# Patient Record
Sex: Male | Born: 1950
Health system: Southern US, Community
[De-identification: ages and names within clinical notes are randomized; demographics above are authoritative.]

## PROBLEM LIST (undated history)

## (undated) DIAGNOSIS — E785 Hyperlipidemia, unspecified: Secondary | ICD-10-CM

## (undated) DIAGNOSIS — K219 Gastro-esophageal reflux disease without esophagitis: Secondary | ICD-10-CM

## (undated) DIAGNOSIS — I10 Essential (primary) hypertension: Secondary | ICD-10-CM

## (undated) DIAGNOSIS — M1990 Unspecified osteoarthritis, unspecified site: Secondary | ICD-10-CM

## (undated) HISTORY — PX: KNEE ARTHROSCOPY: SUR90

## (undated) HISTORY — PX: COLONOSCOPY: SHX174

## (undated) HISTORY — PX: VASECTOMY REVERSAL: SHX243

## (undated) HISTORY — PX: TOE SURGERY: SHX1073

## (undated) HISTORY — PX: TONSILLECTOMY: SUR1361

## (undated) HISTORY — PX: VASECTOMY: SHX75

---

## 2008-09-06 ENCOUNTER — Ambulatory Visit: Payer: Self-pay | Admitting: Internal Medicine

## 2008-09-12 ENCOUNTER — Ambulatory Visit: Payer: Self-pay | Admitting: Internal Medicine

## 2009-05-08 ENCOUNTER — Ambulatory Visit: Payer: Self-pay | Admitting: Gastroenterology

## 2014-04-14 ENCOUNTER — Other Ambulatory Visit (HOSPITAL_COMMUNITY): Payer: Self-pay | Admitting: Orthopedic Surgery

## 2014-04-27 ENCOUNTER — Encounter (HOSPITAL_COMMUNITY)
Admission: RE | Admit: 2014-04-27 | Discharge: 2014-04-27 | Disposition: A | Discharge status: Home / Self Care | Payer: Managed Care, Other (non HMO) | Source: Ambulatory Visit | Attending: Orthopedic Surgery | Admitting: Orthopedic Surgery

## 2014-04-27 ENCOUNTER — Encounter (HOSPITAL_COMMUNITY): Payer: Self-pay | Admitting: Emergency Medicine

## 2014-04-27 ENCOUNTER — Encounter (HOSPITAL_COMMUNITY): Payer: Self-pay | Admitting: Anesthesiology

## 2014-04-27 ENCOUNTER — Encounter (HOSPITAL_COMMUNITY): Payer: Self-pay

## 2014-04-27 DIAGNOSIS — Z01810 Encounter for preprocedural cardiovascular examination: Secondary | ICD-10-CM | POA: Diagnosis not present

## 2014-04-27 DIAGNOSIS — M179 Osteoarthritis of knee, unspecified: Secondary | ICD-10-CM | POA: Insufficient documentation

## 2014-04-27 DIAGNOSIS — Z01812 Encounter for preprocedural laboratory examination: Secondary | ICD-10-CM | POA: Diagnosis not present

## 2014-04-27 HISTORY — DX: Essential (primary) hypertension: I10

## 2014-04-27 HISTORY — DX: Gastro-esophageal reflux disease without esophagitis: K21.9

## 2014-04-27 HISTORY — DX: Unspecified osteoarthritis, unspecified site: M19.90

## 2014-04-27 LAB — SURGICAL PCR SCREEN
MRSA, PCR: NEGATIVE
Staphylococcus aureus: NEGATIVE

## 2014-04-27 LAB — BASIC METABOLIC PANEL
Anion gap: 14 (ref 5–15)
BUN: 21 mg/dL (ref 6–23)
CHLORIDE: 101 meq/L (ref 96–112)
CO2: 28 mEq/L (ref 19–32)
Calcium: 9.8 mg/dL (ref 8.4–10.5)
Creatinine, Ser: 1.3 mg/dL (ref 0.50–1.35)
GFR calc Af Amer: 65 mL/min — ABNORMAL LOW (ref >90)
GFR calc non Af Amer: 56 mL/min — ABNORMAL LOW (ref >90)
Glucose, Bld: 132 mg/dL — ABNORMAL HIGH (ref 70–99)
Potassium: 3.9 mEq/L (ref 3.7–5.3)
Sodium: 143 mEq/L (ref 137–147)

## 2014-04-27 LAB — CBC
HCT: 45.7 % (ref 39.0–52.0)
HEMOGLOBIN: 15.9 g/dL (ref 13.0–17.0)
MCH: 30.1 pg (ref 26.0–34.0)
MCHC: 34.8 g/dL (ref 30.0–36.0)
MCV: 86.4 fL (ref 78.0–100.0)
Platelets: 213 10*3/uL (ref 150–400)
RBC: 5.29 MIL/uL (ref 4.22–5.81)
RDW: 12.4 % (ref 11.5–15.5)
WBC: 7.2 10*3/uL (ref 4.0–10.5)

## 2014-04-27 LAB — TYPE AND SCREEN
ABO/RH(D): B POS
Antibody Screen: NEGATIVE

## 2014-04-27 LAB — ABO/RH: ABO/RH(D): B POS

## 2014-04-27 NOTE — Progress Notes (Addendum)
Called Dr. Randel Pigg office, left message with Maudie Mercury, scheduler, for him to sign orders.  Pt. Stayed to watch video.

## 2014-04-27 NOTE — Pre-Procedure Instructions (Signed)
[facility] [place]  04/27/2014   Your procedure is scheduled on:  Tuesday, Nov. 10  Report to [facility] Main Entrance "A" at 5:30  AM.  Call this number if you have problems the morning of surgery: [phone]                If any questions prior to surgery day call pre-admission [phone]   Remember:   Do not eat food or drink liquids after midnight.   Take these medicines the morning of surgery with A SIP OF WATER: none              Stop aspirin, aleeve 04/27/14   Do not wear jewelry, make-up or nail polish.  Do not wear lotions, powders, or perfumes. You may wear deodorant.  Do not shave 48 hours prior to surgery. Men may shave face and neck.  Do not bring valuables to the hospital.  [facility] is not responsible                  for any belongings or valuables.               Contacts, dentures or bridgework may not be worn into surgery.  Leave suitcase in the car. After surgery it may be brought to your room.  For patients admitted to the hospital, discharge time is determined by your                treatment team.               Patients discharged the day of surgery will not be allowed to drive  home.  Name and phone number of your driver:    Special Instructions: review  Preparing for surgery handout  Please read over the following fact sheets that you were given: Pain Booklet, Coughing and Deep Breathing, Blood Transfusion Information, MRSA Information and Surgical Site Infection Prevention

## 2014-04-27 NOTE — Progress Notes (Addendum)
Anesthesia Chart Review:  Pt is 63 year old male scheduled for L total knee arthroplasty on 05/03/2014 with Dr. Marlou Sa. Former smoker. BMI 30  PMH: HTN, GERD  Medications include: ASA, valsartan/hctz, lipitor, naproxen  Preoperative labs reviewed.    EKG: NSR, incomplete RBBB, L posterior fasicular block  No prior EKG in Muse or Epic. Pt's PCP, Dr. Baldemar Lenis at [facility] in [place], does not have an EKG either.   Reviewed with Dr. Al Corpus. Pt needs cardiac clearance prior to surgery. Cheryl in Dr. Randel Pigg office notified.   Willeen Cass, FNP-BC [facility] Short Stay Surgical Center/Anesthesiology Phone: [phone] 04/27/2014 5:00 PM

## 2014-05-02 MED ORDER — CEFAZOLIN SODIUM-DEXTROSE 2-3 GM-% IV SOLR: 2.0000 g | INTRAVENOUS | Status: DC

## 2014-05-03 ENCOUNTER — Inpatient Hospital Stay (HOSPITAL_COMMUNITY)
Admission: RE | Admit: 2014-05-03 | Payer: Managed Care, Other (non HMO) | Source: Ambulatory Visit | Admitting: Orthopedic Surgery

## 2014-05-03 ENCOUNTER — Encounter (HOSPITAL_COMMUNITY): Admission: RE | Payer: Self-pay | Source: Ambulatory Visit

## 2014-05-03 SURGERY — ARTHROPLASTY, KNEE, TOTAL
Anesthesia: General | Site: Knee | Laterality: Left

## 2014-05-13 ENCOUNTER — Other Ambulatory Visit (HOSPITAL_COMMUNITY): Payer: Self-pay | Admitting: Orthopedic Surgery

## 2014-06-21 ENCOUNTER — Other Ambulatory Visit (HOSPITAL_COMMUNITY): Payer: Self-pay | Admitting: *Deleted

## 2014-06-21 NOTE — Pre-Procedure Instructions (Signed)
Billy Fields  06/21/2014   Your procedure is scheduled on:  Tuesday, July 05, 2014 at 7:30 AM.   Report to [facility] Entrance "A" Admitting Office at 5:30 AM.   Call this number if you have problems the morning of surgery: [phone]               Any questions prior to day of surgery, please call [phone] between 8 & 4 PM.   Remember:   Do not eat food or drink liquids after midnight Monday, 07/04/14.   Take these medicines the morning of surgery with A SIP OF WATER:  None  Stop Aspirin, Vitamins and NSAIDS (Naprosyn, Aleve, Ibuprofen, etc)  as of 06/28/14.   Do not wear jewelry.  Do not wear lotions, powders, or colgone. You may wear deodorant.  Men may shave face and neck.  Do not bring valuables to the hospital.  [facility] is not responsible                  for any belongings or valuables.               Contacts, dentures or bridgework may not be worn into surgery.  Leave suitcase in the car. After surgery it may be brought to your room.  For patients admitted to the hospital, discharge time is determined by your                treatment team.    Special Instructions: [place] - Preparing for Surgery  Before surgery, you can play an important role.  Because skin is not sterile, your skin needs to be as free of germs as possible.  You can reduce the number of germs on you skin by washing with CHG (chlorahexidine gluconate) soap before surgery.  CHG is an antiseptic cleaner which kills germs and bonds with the skin to continue killing germs even after washing.  Please DO NOT use if you have an allergy to CHG or antibacterial soaps.  If your skin becomes reddened/irritated stop using the CHG and inform your nurse when you arrive at Short Stay.  Do not shave (including legs and underarms) for at least 48 hours prior to the first CHG shower.  You may shave your face.  Please follow these instructions carefully:   1.  Shower with CHG Soap the night before  surgery and the                                morning of Surgery.  2.  If you choose to wash your hair, wash your hair first as usual with your       normal shampoo.  3.  After you shampoo, rinse your hair and body thoroughly to remove the                      Shampoo.  4.  Use CHG as you would any other liquid soap.  You can apply chg directly       to the skin and wash gently with scrungie or a clean washcloth.  5.  Apply the CHG Soap to your body ONLY FROM THE NECK DOWN.        Do not use on open wounds or open sores.  Avoid contact with your eyes, ears, mouth and genitals (private parts).  Wash genitals (private parts) with your normal soap.  6.  Wash thoroughly, paying special attention to the area where your surgery        will be performed.  7.  Thoroughly rinse your body with warm water from the neck down.  8.  DO NOT shower/wash with your normal soap after using and rinsing off       the CHG Soap.  9.  Pat yourself dry with a clean towel.            10.  Wear clean pajamas.            11.  Place clean sheets on your bed the night of your first shower and do not        sleep with pets.  Day of Surgery  Do not apply any lotions the morning of surgery.  Please wear clean clothes to the hospital.     Please read over the following fact sheets that you were given: Pain Booklet, Coughing and Deep Breathing, Blood Transfusion Information, MRSA Information and Surgical Site Infection Prevention

## 2014-06-22 ENCOUNTER — Encounter (HOSPITAL_COMMUNITY)
Admission: RE | Admit: 2014-06-22 | Discharge: 2014-06-22 | Disposition: A | Discharge status: Home / Self Care | Payer: Managed Care, Other (non HMO) | Source: Ambulatory Visit | Attending: Orthopedic Surgery | Admitting: Orthopedic Surgery

## 2014-06-22 ENCOUNTER — Encounter (HOSPITAL_COMMUNITY): Payer: Self-pay

## 2014-06-22 DIAGNOSIS — R9431 Abnormal electrocardiogram [ECG] [EKG]: Secondary | ICD-10-CM | POA: Diagnosis not present

## 2014-06-22 DIAGNOSIS — I4519 Other right bundle-branch block: Secondary | ICD-10-CM | POA: Diagnosis not present

## 2014-06-22 DIAGNOSIS — I10 Essential (primary) hypertension: Secondary | ICD-10-CM | POA: Diagnosis not present

## 2014-06-22 DIAGNOSIS — Z01818 Encounter for other preprocedural examination: Secondary | ICD-10-CM

## 2014-06-22 DIAGNOSIS — J439 Emphysema, unspecified: Secondary | ICD-10-CM | POA: Insufficient documentation

## 2014-06-22 LAB — SURGICAL PCR SCREEN
MRSA, PCR: NEGATIVE
Staphylococcus aureus: NEGATIVE

## 2014-06-22 LAB — URINALYSIS, ROUTINE W REFLEX MICROSCOPIC
BILIRUBIN URINE: NEGATIVE
GLUCOSE, UA: NEGATIVE mg/dL
HGB URINE DIPSTICK: NEGATIVE
KETONES UR: NEGATIVE mg/dL
LEUKOCYTES UA: NEGATIVE
Nitrite: NEGATIVE
Protein, ur: NEGATIVE mg/dL
Specific Gravity, Urine: 1.017 (ref 1.005–1.030)
Urobilinogen, UA: 0.2 mg/dL (ref 0.0–1.0)
pH: 7.5 (ref 5.0–8.0)

## 2014-06-22 LAB — CBC
HEMATOCRIT: 48.8 % (ref 39.0–52.0)
Hemoglobin: 16.5 g/dL (ref 13.0–17.0)
MCH: 29.3 pg (ref 26.0–34.0)
MCHC: 33.8 g/dL (ref 30.0–36.0)
MCV: 86.5 fL (ref 78.0–100.0)
Platelets: 198 10*3/uL (ref 150–400)
RBC: 5.64 MIL/uL (ref 4.22–5.81)
RDW: 12.5 % (ref 11.5–15.5)
WBC: 6 10*3/uL (ref 4.0–10.5)

## 2014-06-22 LAB — BASIC METABOLIC PANEL
ANION GAP: 7 (ref 5–15)
BUN: 15 mg/dL (ref 6–23)
CHLORIDE: 105 meq/L (ref 96–112)
CO2: 27 mmol/L (ref 19–32)
Calcium: 9.6 mg/dL (ref 8.4–10.5)
Creatinine, Ser: 1.26 mg/dL (ref 0.50–1.35)
GFR calc Af Amer: 68 mL/min — ABNORMAL LOW (ref >90)
GFR, EST NON AFRICAN AMERICAN: 59 mL/min — AB (ref >90)
Glucose, Bld: 100 mg/dL — ABNORMAL HIGH (ref 70–99)
Potassium: 3.7 mmol/L (ref 3.5–5.1)
Sodium: 139 mmol/L (ref 135–145)

## 2014-06-22 LAB — TYPE AND SCREEN
ABO/RH(D): B POS
ANTIBODY SCREEN: NEGATIVE

## 2014-06-22 LAB — APTT: APTT: 31 s (ref 24–37)

## 2014-06-22 LAB — PROTIME-INR
INR: 1.05 (ref 0.00–1.49)
Prothrombin Time: 13.9 seconds (ref 11.6–15.2)

## 2014-06-22 NOTE — Progress Notes (Signed)
Anesthesia follow-up:  See note from Willeen Cass, FNP-BC from 04/27/14.  Patient has seen been evaluated by cardiologists Dr. Vear Clock and Dr. Adrian Prows [facility] CV) and felt acceptable risk for surgery following a low risk stress test. He is now scheduled to undergo left TKA on 07/05/14.  Nuclear stress test 05/06/14: Resting ECG: NSR, incomplete right BBB, poor r wave progression and no resting arrhythmias. Stress ECG was non-diagnostic due to low pharmacological stress. Myocardial perfusion imaging is normal. Overall LV systolic functinon was normal without regional wall motion abormalities. The LVEF was 64%. Based on the study, patient can be taken up for upcoming surgery with acceptable CV risk.   Echo 05/03/14: LV cavity is normal is size. Normal global wall motion. Grade I (impaired) diastolic dysfunction. Calculated EF 55%. Trileaflet AV with mild AR. Trace MR/TR.  06/22/14 CXR: There is a degree of underlying emphysematous change. No edema or consolidation.  Preoperative labs noted. Urine culture is still pending.   If no acute changes then I anticipate that he can proceed as planned.  George Hugh [facility] Short Stay Center/Anesthesiology Phone [phone] 06/22/2014 11:43 AM

## 2014-06-22 NOTE — Progress Notes (Signed)
Pt states he had a cardiac clearance done in November but he can't remember the name of the cardiologist. After some searching found that it was Dr. Einar Gip. Called Dr. Irven Shelling office and requested copies of OV notes, Echo and Stress test.to be faxed to [place]. Pt states he was told he was cleared.

## 2014-06-23 LAB — URINE CULTURE: Colony Count: 5000

## 2014-07-04 MED ORDER — CEFAZOLIN SODIUM-DEXTROSE 2-3 GM-% IV SOLR
2.0000 g | INTRAVENOUS | Status: AC
  Administered 2014-07-05: 2 g via INTRAVENOUS
  Filled 2014-07-04: qty 50

## 2014-07-05 ENCOUNTER — Encounter (HOSPITAL_COMMUNITY): Payer: Self-pay | Admitting: *Deleted

## 2014-07-05 ENCOUNTER — Inpatient Hospital Stay (HOSPITAL_COMMUNITY)
Admission: RE | Admit: 2014-07-05 | Discharge: 2014-07-08 | DRG: 470 | Disposition: A | Discharge status: Home Health | Payer: Managed Care, Other (non HMO) | Source: Ambulatory Visit | Attending: Orthopedic Surgery | Admitting: Orthopedic Surgery

## 2014-07-05 ENCOUNTER — Encounter (HOSPITAL_COMMUNITY)
Admission: RE | Disposition: A | Discharge status: Home Health | Payer: Self-pay | Source: Ambulatory Visit | Attending: Orthopedic Surgery

## 2014-07-05 ENCOUNTER — Inpatient Hospital Stay (HOSPITAL_COMMUNITY): Payer: Managed Care, Other (non HMO) | Admitting: Anesthesiology

## 2014-07-05 ENCOUNTER — Inpatient Hospital Stay (HOSPITAL_COMMUNITY): Payer: Managed Care, Other (non HMO) | Admitting: Vascular Surgery

## 2014-07-05 DIAGNOSIS — K219 Gastro-esophageal reflux disease without esophagitis: Secondary | ICD-10-CM | POA: Diagnosis present

## 2014-07-05 DIAGNOSIS — Z8249 Family history of ischemic heart disease and other diseases of the circulatory system: Secondary | ICD-10-CM | POA: Diagnosis not present

## 2014-07-05 DIAGNOSIS — Z87891 Personal history of nicotine dependence: Secondary | ICD-10-CM

## 2014-07-05 DIAGNOSIS — M179 Osteoarthritis of knee, unspecified: Principal | ICD-10-CM | POA: Diagnosis present

## 2014-07-05 DIAGNOSIS — I10 Essential (primary) hypertension: Secondary | ICD-10-CM | POA: Diagnosis present

## 2014-07-05 DIAGNOSIS — Z7901 Long term (current) use of anticoagulants: Secondary | ICD-10-CM

## 2014-07-05 DIAGNOSIS — Z7982 Long term (current) use of aspirin: Secondary | ICD-10-CM | POA: Diagnosis not present

## 2014-07-05 DIAGNOSIS — M1710 Unilateral primary osteoarthritis, unspecified knee: Secondary | ICD-10-CM | POA: Diagnosis present

## 2014-07-05 DIAGNOSIS — M25562 Pain in left knee: Secondary | ICD-10-CM | POA: Diagnosis present

## 2014-07-05 DIAGNOSIS — Z79899 Other long term (current) drug therapy: Secondary | ICD-10-CM | POA: Diagnosis not present

## 2014-07-05 HISTORY — PX: TOTAL KNEE ARTHROPLASTY: SHX125

## 2014-07-05 SURGERY — ARTHROPLASTY, KNEE, TOTAL
Anesthesia: Monitor Anesthesia Care | Site: Knee | Laterality: Left

## 2014-07-05 MED ORDER — FENTANYL CITRATE 0.05 MG/ML IJ SOLN
INTRAMUSCULAR | Status: DC | PRN
  Administered 2014-07-05 (×2): 50 ug via INTRAVENOUS

## 2014-07-05 MED ORDER — ADULT MULTIVITAMIN W/MINERALS CH
1.0000 | ORAL_TABLET | Freq: Every day | ORAL | Status: DC
  Administered 2014-07-06 – 2014-07-08 (×3): 1 via ORAL
  Filled 2014-07-05 (×3): qty 1

## 2014-07-05 MED ORDER — SODIUM CHLORIDE 0.9 % IV SOLN
1000.0000 mg | INTRAVENOUS | Status: AC
  Administered 2014-07-05: 1000 mg via INTRAVENOUS
  Filled 2014-07-05: qty 10

## 2014-07-05 MED ORDER — LIDOCAINE HCL (CARDIAC) 20 MG/ML IV SOLN
INTRAVENOUS | Status: DC | PRN
  Administered 2014-07-05: 50 mg via INTRAVENOUS

## 2014-07-05 MED ORDER — BUPIVACAINE IN DEXTROSE 0.75-8.25 % IT SOLN
INTRATHECAL | Status: DC | PRN
  Administered 2014-07-05: 15 mg via INTRATHECAL

## 2014-07-05 MED ORDER — OXYCODONE HCL 5 MG PO TABS
ORAL_TABLET | ORAL | Status: AC
  Administered 2014-07-05: 5 mg
  Filled 2014-07-05: qty 1

## 2014-07-05 MED ORDER — DEXAMETHASONE SODIUM PHOSPHATE 4 MG/ML IJ SOLN
INTRAMUSCULAR | Status: DC | PRN
  Administered 2014-07-05: 4 mg via INTRAVENOUS

## 2014-07-05 MED ORDER — PHENOL 1.4 % MT LIQD: 1.0000 | OROMUCOSAL | Status: DC | PRN

## 2014-07-05 MED ORDER — CLONIDINE HCL (ANALGESIA) 100 MCG/ML EP SOLN
150.0000 ug | Freq: Once | EPIDURAL | Status: AC
  Administered 2014-07-05: 100 ug via INTRA_ARTICULAR
  Filled 2014-07-05: qty 1.5

## 2014-07-05 MED ORDER — METOCLOPRAMIDE HCL 10 MG PO TABS: 5.0000 mg | ORAL_TABLET | Freq: Three times a day (TID) | ORAL | Status: DC | PRN

## 2014-07-05 MED ORDER — MIDAZOLAM HCL 2 MG/2ML IJ SOLN
INTRAMUSCULAR | Status: AC
  Filled 2014-07-05: qty 2

## 2014-07-05 MED ORDER — MENTHOL 3 MG MT LOZG: 1.0000 | LOZENGE | OROMUCOSAL | Status: DC | PRN

## 2014-07-05 MED ORDER — WARFARIN - PHARMACIST DOSING INPATIENT: Freq: Every day | Status: DC

## 2014-07-05 MED ORDER — WARFARIN SODIUM 6 MG PO TABS
6.0000 mg | ORAL_TABLET | Freq: Once | ORAL | Status: AC
  Administered 2014-07-05: 6 mg via ORAL
  Filled 2014-07-05: qty 1

## 2014-07-05 MED ORDER — WARFARIN VIDEO: Freq: Once | Status: DC

## 2014-07-05 MED ORDER — PROPOFOL 10 MG/ML IV BOLUS
INTRAVENOUS | Status: AC
  Filled 2014-07-05: qty 20

## 2014-07-05 MED ORDER — BUPIVACAINE HCL (PF) 0.25 % IJ SOLN
INTRAMUSCULAR | Status: AC
  Filled 2014-07-05: qty 30

## 2014-07-05 MED ORDER — DEXAMETHASONE SODIUM PHOSPHATE 4 MG/ML IJ SOLN
INTRAMUSCULAR | Status: AC
  Filled 2014-07-05: qty 1

## 2014-07-05 MED ORDER — METOCLOPRAMIDE HCL 5 MG/ML IJ SOLN: 5.0000 mg | Freq: Three times a day (TID) | INTRAMUSCULAR | Status: DC | PRN

## 2014-07-05 MED ORDER — MORPHINE SULFATE 4 MG/ML IJ SOLN
INTRAMUSCULAR | Status: DC | PRN
  Administered 2014-07-05: 8 mg

## 2014-07-05 MED ORDER — SODIUM CHLORIDE 0.9 % IJ SOLN
INTRAMUSCULAR | Status: AC
  Filled 2014-07-05: qty 10

## 2014-07-05 MED ORDER — PROPOFOL INFUSION 10 MG/ML OPTIME
INTRAVENOUS | Status: DC | PRN
  Administered 2014-07-05: 10:00:00 via INTRAVENOUS
  Administered 2014-07-05: 75 ug/kg/min via INTRAVENOUS

## 2014-07-05 MED ORDER — FENTANYL CITRATE 0.05 MG/ML IJ SOLN
INTRAMUSCULAR | Status: AC
  Filled 2014-07-05: qty 5

## 2014-07-05 MED ORDER — HYDROMORPHONE HCL 1 MG/ML IJ SOLN
INTRAMUSCULAR | Status: AC
  Filled 2014-07-05: qty 1

## 2014-07-05 MED ORDER — SUCCINYLCHOLINE CHLORIDE 20 MG/ML IJ SOLN
INTRAMUSCULAR | Status: AC
  Filled 2014-07-05: qty 1

## 2014-07-05 MED ORDER — BUPIVACAINE-EPINEPHRINE (PF) 0.5% -1:200000 IJ SOLN
INTRAMUSCULAR | Status: AC
  Filled 2014-07-05: qty 30

## 2014-07-05 MED ORDER — BUPIVACAINE-EPINEPHRINE 0.5% -1:200000 IJ SOLN
INTRAMUSCULAR | Status: DC | PRN
  Administered 2014-07-05: 20 mL

## 2014-07-05 MED ORDER — 0.9 % SODIUM CHLORIDE (POUR BTL) OPTIME
TOPICAL | Status: DC | PRN
  Administered 2014-07-05 (×2): 1000 mL

## 2014-07-05 MED ORDER — HYDROCHLOROTHIAZIDE 25 MG PO TABS
25.0000 mg | ORAL_TABLET | Freq: Every day | ORAL | Status: DC
  Administered 2014-07-05 – 2014-07-08 (×4): 25 mg via ORAL
  Filled 2014-07-05 (×4): qty 1

## 2014-07-05 MED ORDER — LACTATED RINGERS IV SOLN
INTRAVENOUS | Status: DC | PRN
  Administered 2014-07-05 (×2): via INTRAVENOUS

## 2014-07-05 MED ORDER — ACETAMINOPHEN 325 MG PO TABS
650.0000 mg | ORAL_TABLET | Freq: Four times a day (QID) | ORAL | Status: DC | PRN
Start: 2014-07-05 — End: 2014-07-08

## 2014-07-05 MED ORDER — CLONIDINE HCL (ANALGESIA) 100 MCG/ML EP SOLN: EPIDURAL | Status: DC | PRN

## 2014-07-05 MED ORDER — ONDANSETRON HCL 4 MG/2ML IJ SOLN
INTRAMUSCULAR | Status: AC
  Filled 2014-07-05: qty 2

## 2014-07-05 MED ORDER — HYDROMORPHONE HCL 1 MG/ML IJ SOLN
0.2500 mg | INTRAMUSCULAR | Status: DC | PRN
  Administered 2014-07-05 (×2): 0.5 mg via INTRAVENOUS

## 2014-07-05 MED ORDER — VALSARTAN-HYDROCHLOROTHIAZIDE 320-25 MG PO TABS: 1.0000 | ORAL_TABLET | Freq: Every day | ORAL | Status: DC

## 2014-07-05 MED ORDER — PHENYLEPHRINE 40 MCG/ML (10ML) SYRINGE FOR IV PUSH (FOR BLOOD PRESSURE SUPPORT)
PREFILLED_SYRINGE | INTRAVENOUS | Status: AC
  Filled 2014-07-05: qty 10

## 2014-07-05 MED ORDER — SODIUM CHLORIDE 0.9 % IJ SOLN
INTRAMUSCULAR | Status: DC | PRN
  Administered 2014-07-05: 60 mL

## 2014-07-05 MED ORDER — IRBESARTAN 300 MG PO TABS
300.0000 mg | ORAL_TABLET | Freq: Every day | ORAL | Status: DC
  Administered 2014-07-05 – 2014-07-08 (×4): 300 mg via ORAL
  Filled 2014-07-05 (×4): qty 1

## 2014-07-05 MED ORDER — OXYCODONE HCL 5 MG PO TABS
5.0000 mg | ORAL_TABLET | ORAL | Status: DC | PRN
  Administered 2014-07-05: 5 mg via ORAL
  Administered 2014-07-05 – 2014-07-08 (×12): 10 mg via ORAL
  Filled 2014-07-05 (×14): qty 2

## 2014-07-05 MED ORDER — ROCURONIUM BROMIDE 50 MG/5ML IV SOLN
INTRAVENOUS | Status: AC
  Filled 2014-07-05: qty 1

## 2014-07-05 MED ORDER — OXYCODONE HCL 5 MG PO TABS
ORAL_TABLET | ORAL | Status: AC
  Filled 2014-07-05: qty 1

## 2014-07-05 MED ORDER — METHOCARBAMOL 1000 MG/10ML IJ SOLN
500.0000 mg | INTRAVENOUS | Status: AC
  Administered 2014-07-05: 500 mg via INTRAVENOUS
  Filled 2014-07-05: qty 5

## 2014-07-05 MED ORDER — EPHEDRINE SULFATE 50 MG/ML IJ SOLN
INTRAMUSCULAR | Status: AC
  Filled 2014-07-05: qty 1

## 2014-07-05 MED ORDER — CHLORHEXIDINE GLUCONATE 4 % EX LIQD
60.0000 mL | Freq: Once | CUTANEOUS | Status: DC
  Filled 2014-07-05: qty 60

## 2014-07-05 MED ORDER — LIDOCAINE HCL (CARDIAC) 20 MG/ML IV SOLN
INTRAVENOUS | Status: AC
  Filled 2014-07-05: qty 5

## 2014-07-05 MED ORDER — ONDANSETRON HCL 4 MG/2ML IJ SOLN: 4.0000 mg | Freq: Four times a day (QID) | INTRAMUSCULAR | Status: DC | PRN

## 2014-07-05 MED ORDER — CEFAZOLIN SODIUM-DEXTROSE 2-3 GM-% IV SOLR
2.0000 g | Freq: Three times a day (TID) | INTRAVENOUS | Status: AC
  Administered 2014-07-05 – 2014-07-06 (×2): 2 g via INTRAVENOUS
  Filled 2014-07-05 (×2): qty 50

## 2014-07-05 MED ORDER — COUMADIN BOOK
Freq: Once | Status: DC
  Filled 2014-07-05: qty 1

## 2014-07-05 MED ORDER — ATORVASTATIN CALCIUM 10 MG PO TABS
10.0000 mg | ORAL_TABLET | Freq: Every day | ORAL | Status: DC
  Administered 2014-07-05 – 2014-07-07 (×3): 10 mg via ORAL
  Filled 2014-07-05 (×4): qty 1

## 2014-07-05 MED ORDER — MIDAZOLAM HCL 5 MG/5ML IJ SOLN
INTRAMUSCULAR | Status: DC | PRN
  Administered 2014-07-05: 2 mg via INTRAVENOUS

## 2014-07-05 MED ORDER — ONDANSETRON HCL 4 MG PO TABS: 4.0000 mg | ORAL_TABLET | Freq: Four times a day (QID) | ORAL | Status: DC | PRN

## 2014-07-05 MED ORDER — POTASSIUM CHLORIDE IN NACL 20-0.9 MEQ/L-% IV SOLN
INTRAVENOUS | Status: AC
  Administered 2014-07-05 – 2014-07-06 (×2): via INTRAVENOUS
  Filled 2014-07-05 (×2): qty 1000

## 2014-07-05 MED ORDER — MORPHINE SULFATE 4 MG/ML IJ SOLN
INTRAMUSCULAR | Status: AC
  Filled 2014-07-05: qty 2

## 2014-07-05 MED ORDER — METHOCARBAMOL 1000 MG/10ML IJ SOLN
500.0000 mg | Freq: Four times a day (QID) | INTRAVENOUS | Status: DC | PRN
  Filled 2014-07-05: qty 5

## 2014-07-05 MED ORDER — GLYCOPYRROLATE 0.2 MG/ML IJ SOLN
INTRAMUSCULAR | Status: AC
  Filled 2014-07-05: qty 1

## 2014-07-05 MED ORDER — METHOCARBAMOL 500 MG PO TABS
500.0000 mg | ORAL_TABLET | Freq: Four times a day (QID) | ORAL | Status: DC | PRN
  Administered 2014-07-05 – 2014-07-08 (×6): 500 mg via ORAL
  Filled 2014-07-05 (×6): qty 1

## 2014-07-05 MED ORDER — SODIUM CHLORIDE 0.9 % IR SOLN
Status: DC | PRN
  Administered 2014-07-05: 3000 mL
  Administered 2014-07-05 (×2): 1000 mL

## 2014-07-05 MED ORDER — ONDANSETRON HCL 4 MG/2ML IJ SOLN
INTRAMUSCULAR | Status: DC | PRN
  Administered 2014-07-05: 4 mg via INTRAVENOUS

## 2014-07-05 MED ORDER — MORPHINE SULFATE 4 MG/ML IJ SOLN
4.0000 mg | INTRAMUSCULAR | Status: DC | PRN
  Administered 2014-07-05 – 2014-07-06 (×2): 4 mg via INTRAVENOUS
  Filled 2014-07-05 (×2): qty 1

## 2014-07-05 MED ORDER — ACETAMINOPHEN 650 MG RE SUPP: 650.0000 mg | Freq: Four times a day (QID) | RECTAL | Status: DC | PRN

## 2014-07-05 MED ORDER — BUPIVACAINE LIPOSOME 1.3 % IJ SUSP
20.0000 mL | INTRAMUSCULAR | Status: AC
  Administered 2014-07-05: 20 mL
  Filled 2014-07-05: qty 20

## 2014-07-05 SURGICAL SUPPLY — 78 items
BANDAGE ELASTIC 4 VELCRO ST LF (GAUZE/BANDAGES/DRESSINGS) IMPLANT
BANDAGE ESMARK 6X9 LF (GAUZE/BANDAGES/DRESSINGS) ×1 IMPLANT
BLADE SAG 18X100X1.27 (BLADE) ×3 IMPLANT
BLADE SAW SGTL 13.0X1.19X90.0M (BLADE) ×3 IMPLANT
BNDG COHESIVE 6X5 TAN STRL LF (GAUZE/BANDAGES/DRESSINGS) ×3 IMPLANT
BNDG ELASTIC 6X10 VLCR STRL LF (GAUZE/BANDAGES/DRESSINGS) ×3 IMPLANT
BNDG ESMARK 6X9 LF (GAUZE/BANDAGES/DRESSINGS) ×3
BOWL SMART MIX CTS (DISPOSABLE) ×3 IMPLANT
CAPT KNEE TOTAL 3 ×3 IMPLANT
CEMENT BONE SIMPLEX SPEEDSET (Cement) ×6 IMPLANT
COVER SURGICAL LIGHT HANDLE (MISCELLANEOUS) ×3 IMPLANT
CUFF TOURNIQUET SINGLE 34IN LL (TOURNIQUET CUFF) ×3 IMPLANT
CUFF TOURNIQUET SINGLE 44IN (TOURNIQUET CUFF) IMPLANT
DRAPE IMP U-DRAPE 54X76 (DRAPES) IMPLANT
DRAPE INCISE IOBAN 66X45 STRL (DRAPES) ×3 IMPLANT
DRAPE ORTHO SPLIT 77X108 STRL (DRAPES) ×6
DRAPE SURG ORHT 6 SPLT 77X108 (DRAPES) ×3 IMPLANT
DRAPE U-SHAPE 47X51 STRL (DRAPES) ×3 IMPLANT
DRSG AQUACEL AG ADV 3.5X14 (GAUZE/BANDAGES/DRESSINGS) ×3 IMPLANT
DRSG PAD ABDOMINAL 8X10 ST (GAUZE/BANDAGES/DRESSINGS) ×3 IMPLANT
DURAPREP 26ML APPLICATOR (WOUND CARE) ×3 IMPLANT
ELECT REM PT RETURN 9FT ADLT (ELECTROSURGICAL) ×3
ELECTRODE REM PT RTRN 9FT ADLT (ELECTROSURGICAL) ×1 IMPLANT
FACESHIELD WRAPAROUND (MASK) IMPLANT
GAUZE SPONGE 4X4 12PLY STRL (GAUZE/BANDAGES/DRESSINGS) IMPLANT
GAUZE XEROFORM 5X9 LF (GAUZE/BANDAGES/DRESSINGS) IMPLANT
GLOVE BIOGEL PI IND STRL 6.5 (GLOVE) ×1 IMPLANT
GLOVE BIOGEL PI IND STRL 7.5 (GLOVE) ×1 IMPLANT
GLOVE BIOGEL PI IND STRL 8 (GLOVE) ×1 IMPLANT
GLOVE BIOGEL PI INDICATOR 6.5 (GLOVE) ×2
GLOVE BIOGEL PI INDICATOR 7.5 (GLOVE) ×2
GLOVE BIOGEL PI INDICATOR 8 (GLOVE) ×2
GLOVE ECLIPSE 6.5 STRL STRAW (GLOVE) ×3 IMPLANT
GLOVE ECLIPSE 7.0 STRL STRAW (GLOVE) ×6 IMPLANT
GLOVE SURG ORTHO 8.0 STRL STRW (GLOVE) ×3 IMPLANT
GOWN STRL REUS W/ TWL LRG LVL3 (GOWN DISPOSABLE) ×2 IMPLANT
GOWN STRL REUS W/ TWL XL LVL3 (GOWN DISPOSABLE) ×1 IMPLANT
GOWN STRL REUS W/TWL LRG LVL3 (GOWN DISPOSABLE) ×4
GOWN STRL REUS W/TWL XL LVL3 (GOWN DISPOSABLE) ×2
HANDPIECE INTERPULSE COAX TIP (DISPOSABLE) ×2
HOOD PEEL AWAY FACE SHEILD DIS (HOOD) ×9 IMPLANT
IMMOBILIZER KNEE 20 (SOFTGOODS) IMPLANT
IMMOBILIZER KNEE 22 (SOFTGOODS) ×3 IMPLANT
IMMOBILIZER KNEE 22 UNIV (SOFTGOODS) ×3 IMPLANT
IMMOBILIZER KNEE 24 THIGH 36 (MISCELLANEOUS) IMPLANT
IMMOBILIZER KNEE 24 UNIV (MISCELLANEOUS)
KIT BASIN OR (CUSTOM PROCEDURE TRAY) ×3 IMPLANT
KIT ROOM TURNOVER OR (KITS) ×3 IMPLANT
MANIFOLD NEPTUNE II (INSTRUMENTS) ×3 IMPLANT
NEEDLE 18GX1X1/2 (RX/OR ONLY) (NEEDLE) IMPLANT
NEEDLE 21X1 OR PACK (NEEDLE) IMPLANT
NEEDLE HYPO 22GX1.5 SAFETY (NEEDLE) ×6 IMPLANT
NEEDLE SPNL 18GX3.5 QUINCKE PK (NEEDLE) ×3 IMPLANT
NS IRRIG 1000ML POUR BTL (IV SOLUTION) ×6 IMPLANT
PACK TOTAL JOINT (CUSTOM PROCEDURE TRAY) ×3 IMPLANT
PACK UNIVERSAL I (CUSTOM PROCEDURE TRAY) ×3 IMPLANT
PAD ARMBOARD 7.5X6 YLW CONV (MISCELLANEOUS) ×6 IMPLANT
PAD CAST 4YDX4 CTTN HI CHSV (CAST SUPPLIES) IMPLANT
PADDING CAST COTTON 4X4 STRL (CAST SUPPLIES)
PADDING CAST COTTON 6X4 STRL (CAST SUPPLIES) ×3 IMPLANT
RUBBERBAND STERILE (MISCELLANEOUS) IMPLANT
SET HNDPC FAN SPRY TIP SCT (DISPOSABLE) ×1 IMPLANT
SPONGE LAP 18X18 X RAY DECT (DISPOSABLE) ×3 IMPLANT
STAPLER VISISTAT 35W (STAPLE) ×3 IMPLANT
SUCTION FRAZIER TIP 10 FR DISP (SUCTIONS) ×3 IMPLANT
SUT VIC AB 0 CTB1 27 (SUTURE) ×9 IMPLANT
SUT VIC AB 1 CT1 27 (SUTURE) ×12
SUT VIC AB 1 CT1 27XBRD ANBCTR (SUTURE) ×6 IMPLANT
SUT VIC AB 2-0 CT1 27 (SUTURE) ×4
SUT VIC AB 2-0 CT1 TAPERPNT 27 (SUTURE) ×2 IMPLANT
SYR 30ML LL (SYRINGE) IMPLANT
SYR 30ML SLIP (SYRINGE) IMPLANT
SYR 50ML LL SCALE MARK (SYRINGE) IMPLANT
SYR TB 1ML LUER SLIP (SYRINGE) ×3 IMPLANT
TOWEL OR 17X24 6PK STRL BLUE (TOWEL DISPOSABLE) ×6 IMPLANT
TOWEL OR 17X26 10 PK STRL BLUE (TOWEL DISPOSABLE) ×6 IMPLANT
TRAY FOLEY CATH 16FRSI W/METER (SET/KITS/TRAYS/PACK) ×3 IMPLANT
WATER STERILE IRR 1000ML POUR (IV SOLUTION) IMPLANT

## 2014-07-05 NOTE — Anesthesia Preprocedure Evaluation (Addendum)
Anesthesia Evaluation  Patient identified by MRN, date of birth, ID band Patient awake    Reviewed: Allergy & Precautions, H&P , NPO status , Patient's Chart, lab work & pertinent test results  Airway Mallampati: II  TM Distance: >3 FB Neck ROM: Full    Dental no notable dental hx. (+) Teeth Intact, Dental Advisory Given   Pulmonary neg pulmonary ROS, former smoker,  breath sounds clear to auscultation  Pulmonary exam normal       Cardiovascular hypertension, Pt. on medications Rhythm:Regular Rate:Normal     Neuro/Psych negative neurological ROS  negative psych ROS   GI/Hepatic Neg liver ROS, GERD-  Controlled,  Endo/Other  negative endocrine ROS  Renal/GU negative Renal ROS  negative genitourinary   Musculoskeletal  (+) Arthritis -, Osteoarthritis,    Abdominal   Peds  Hematology negative hematology ROS (+)   Anesthesia Other Findings   Reproductive/Obstetrics negative OB ROS                            Anesthesia Physical Anesthesia Plan  ASA: II  Anesthesia Plan: MAC and Spinal   Post-op Pain Management:    Induction: Intravenous  Airway Management Planned: Simple Face Mask  Additional Equipment:   Intra-op Plan:   Post-operative Plan:   Informed Consent: I have reviewed the patients History and Physical, chart, labs and discussed the procedure including the risks, benefits and alternatives for the proposed anesthesia with the patient or authorized representative who has indicated his/her understanding and acceptance.   Dental advisory given  Plan Discussed with: CRNA and Surgeon  Anesthesia Plan Comments:        Anesthesia Quick Evaluation

## 2014-07-05 NOTE — Progress Notes (Signed)
Report given  To Leighton Brickley rn as caregiver rn

## 2014-07-05 NOTE — Anesthesia Procedure Notes (Addendum)
Spinal Patient location during procedure: OR Start time: 07/05/2014 7:32 AM End time: 07/05/2014 7:38 AM Staffing Anesthesiologist: Roderic Palau E Performed by: anesthesiologist  Preanesthetic Checklist Completed: patient identified, surgical consent, pre-op evaluation, timeout performed, IV checked, risks and benefits discussed and monitors and equipment checked Spinal Block Patient position: sitting Prep: Betadine Patient monitoring: cardiac monitor, continuous pulse ox and blood pressure Approach: midline Location: L3-4 Injection technique: single-shot Needle Needle type: Pencan  Needle gauge: 24 G Needle length: 9 cm Assessment Sensory level: T10 Events: paresthesia Additional Notes Functioning IV was confirmed and monitors were applied. Sterile prep and drape, including hand hygiene and sterile gloves were used. The patient was positioned and the spine was prepped. The skin was anesthetized with lidocaine.  Free flow of clear CSF was obtained prior to injecting local anesthetic into the CSF.  The spinal needle aspirated freely following injection.  The needle was carefully withdrawn.  The patient tolerated the procedure well.   Procedure Name: MAC Date/Time: 07/05/2014 7:40 AM Performed by: Jenne Campus Pre-anesthesia Checklist: Patient identified, Emergency Drugs available, Suction available, Patient being monitored and Timeout performed Patient Re-evaluated:Patient Re-evaluated prior to inductionOxygen Delivery Method: Simple face mask

## 2014-07-05 NOTE — Progress Notes (Signed)
ANTICOAGULATION CONSULT NOTE - Initial Consult  Pharmacy Consult for Coumadin Indication: VTE prophylaxis  No Known Allergies  Patient Measurements: Height: 6' (182.9 cm) Weight: 231 lb (104.781 kg) IBW/kg (Calculated) : 77.6 Heparin Dosing Weight:    Vital Signs: Temp: 98.2 F (36.8 C) (01/12 1550) Temp Source: Oral (01/12 0553) BP: 121/77 mmHg (01/12 1550) Pulse Rate: 84 (01/12 1550)  Labs: No results for input(s): HGB, HCT, PLT, APTT, LABPROT, INR, HEPARINUNFRC, CREATININE, CKTOTAL, CKMB, TROPONINI in the last 72 hours.  Estimated Creatinine Clearance: 74.1 mL/min (by C-G formula based on Cr of 1.26).   Medical History: Past Medical History  Diagnosis Date  . Hypertension   . GERD (gastroesophageal reflux disease)   . Arthritis     Medications:  Prescriptions prior to admission  Medication Sig Dispense Refill Last Dose  . aspirin EC 81 MG tablet Take 81 mg by mouth daily.   Past Week at Unknown time  . atorvastatin (LIPITOR) 10 MG tablet Take 10 mg by mouth daily at 6 PM.   Past Week at Unknown time  . Multiple Vitamin (MULTIVITAMIN WITH MINERALS) TABS tablet Take 1 tablet by mouth daily.   Past Week at Unknown time  . naproxen sodium (ANAPROX) 220 MG tablet Take 440 mg by mouth daily as needed (knee pain).   Past Week at Unknown time  . valsartan-hydrochlorothiazide (DIOVAN-HCT) 320-25 MG per tablet Take 1 tablet by mouth daily.   07/04/2014 at Unknown time    Assessment: L TKA 64 y/o M s/p L TKA to start warfarin for VTE prophx.  Baseline INR 1.05. Baseline Hgb 16.5. Plts 198. Scr 1.26 with CrCl 74. No anticoagulation PTA other than ASA 81mg /day.   Goal of Therapy:  INR 2-3 Monitor platelets by anticoagulation protocol: Yes   Plan:  Coumadin 6mg  po x 1 tonight. Daily INR Book/Video education  Regis Hinton S. Alford Highland, PharmD, BCPS Clinical Staff Pharmacist Pager [phone]  Eilene Ghazi Stillinger 07/05/2014,5:00 PM

## 2014-07-05 NOTE — Care Management (Signed)
Utilization Review completed   Alexandre Faries,RN, BSN,CCM 

## 2014-07-05 NOTE — Transfer of Care (Signed)
Immediate Anesthesia Transfer of Care Note  Patient: Billy Fields  Procedure(s) Performed: Procedure(s): TOTAL KNEE ARTHROPLASTY (Left)  Patient Location: PACU  Anesthesia Type:MAC and Spinal  Level of Consciousness: awake, oriented and patient cooperative  Airway & Oxygen Therapy: Patient Spontanous Breathing  Post-op Assessment: Report given to PACU RN and Post -op Vital signs reviewed and stable  Post vital signs: Reviewed  Complications: No apparent anesthesia complications

## 2014-07-05 NOTE — H&P (Addendum)
TOTAL KNEE ADMISSION H&P  Patient is being admitted for left total knee arthroplasty.  Subjective:  Chief Complaint:left knee pain.  HPI: Billy Fields, 64 y.o. male, has a history of pain and functional disability in the left knee due to arthritis and has failed non-surgical conservative treatments for greater than 12 weeks to includeNSAID's and/or analgesics, corticosteriod injections, viscosupplementation injections and activity modification.  Onset of symptoms was gradual, starting >10 years ago with gradually worsening course since that time. The patient noted prior procedures on the knee to include  arthroscopy on the left knee(s).  Patient currently rates pain in the left knee(s) at 9 out of 10 with activity. Patient has night pain, worsening of pain with activity and weight bearing, pain that interferes with activities of daily living, pain with passive range of motion, crepitus and joint swelling.  Patient has evidence of subchondral cysts, subchondral sclerosis, periarticular osteophytes and joint space narrowing by imaging studies. This patient has had no history of PE or DVT. There is no active infection.  There are no active problems to display for this patient.  Past Medical History  Diagnosis Date  . Hypertension   . GERD (gastroesophageal reflux disease)   . Arthritis     Past Surgical History  Procedure Laterality Date  . Toe surgery Right   . Tonsillectomy    . Colonoscopy    . Vasectomy    . Knee arthroscopy Left     Prescriptions prior to admission  Medication Sig Dispense Refill Last Dose  . aspirin EC 81 MG tablet Take 81 mg by mouth daily.   Past Week at Unknown time  . atorvastatin (LIPITOR) 10 MG tablet Take 10 mg by mouth daily at 6 PM.   Past Week at Unknown time  . Multiple Vitamin (MULTIVITAMIN WITH MINERALS) TABS tablet Take 1 tablet by mouth daily.   Past Week at Unknown time  . naproxen sodium (ANAPROX) 220 MG tablet Take 440 mg by mouth daily as  needed (knee pain).   Past Week at Unknown time  . valsartan-hydrochlorothiazide (DIOVAN-HCT) 320-25 MG per tablet Take 1 tablet by mouth daily.   07/04/2014 at Unknown time   No Known Allergies  History  Substance Use Topics  . Smoking status: Former Smoker    Quit date: 03/27/1998  . Smokeless tobacco: Never Used  . Alcohol Use: No    Family History  Problem Relation Age of Onset  . Heart attack Mother   . Cirrhosis Father   . CAD Father      Review of Systems  Constitutional: Negative.   HENT: Negative.   Eyes: Negative.   Respiratory: Negative.   Cardiovascular: Negative.   Gastrointestinal: Negative.   Genitourinary: Negative.   Musculoskeletal: Positive for joint pain.  Skin: Negative.   Neurological: Negative.   Endo/Heme/Allergies: Negative.   Psychiatric/Behavioral: Negative.     Objective:  Physical Exam  Constitutional: He appears well-developed.  HENT:  Head: Normocephalic.  Eyes: Pupils are equal, round, and reactive to light.  Neck: Normal range of motion.  Cardiovascular: Normal rate.   Respiratory: Effort normal.  Neurological: He is alert.  Skin: Skin is warm.  Psychiatric: He has a normal mood and affect.  left knee skin intact - rom 10 - 115 - pf crepitus - dp 2 - 4 - df pf ok - ext mechanism ok  Vital signs in last 24 hours: Temp:  [97.8 F (36.6 C)] 97.8 F (36.6 C) (01/12 0553) Pulse Rate:  [  84] 84 (01/12 0553) Resp:  [20] 20 (01/12 0553) BP: (125)/(81) 125/81 mmHg (01/12 0553) SpO2:  [99 %] 99 % (01/12 0553) Weight:  [104.781 kg (231 lb)] 104.781 kg (231 lb) (01/12 0551)  Labs:   Estimated body mass index is 31.32 kg/(m^2) as calculated from the following:   Height as of this encounter: 6' (1.829 m).   Weight as of this encounter: 104.781 kg (231 lb).   Imaging Review Plain radiographs demonstrate severe degenerative joint disease of the left knee(s). The overall alignment ismild varus. The bone quality appears to be good for  age and reported activity level.  Assessment/Plan:  End stage arthritis, left knee   The patient history, physical examination, clinical judgment of the provider and imaging studies are consistent with end stage degenerative joint disease of the left knee(s) and total knee arthroplasty is deemed medically necessary. The treatment options including medical management, injection therapy arthroscopy and arthroplasty were discussed at length. The risks and benefits of total knee arthroplasty were presented and reviewed. The risks due to aseptic loosening, infection, stiffness, patella tracking problems, thromboembolic complications and other imponderables were discussed. The patient acknowledged the explanation, agreed to proceed with the plan and consent was signed. Patient is being admitted for inpatient treatment for surgery, pain control, PT, OT, prophylactic antibiotics, VTE prophylaxis, progressive ambulation and ADL's and discharge planning. The patient is planning to be discharged home with home health services

## 2014-07-05 NOTE — Anesthesia Postprocedure Evaluation (Signed)
  Anesthesia Post-op Note  Patient: Billy Fields  Procedure(s) Performed: Procedure(s): TOTAL KNEE ARTHROPLASTY (Left)  Patient Location: PACU  Anesthesia Type: Spinal/MAC  Level of Consciousness: awake and alert   Airway and Oxygen Therapy: Patient Spontanous Breathing  Post-op Pain: mild  Post-op Assessment: Post-op Vital signs reviewed, Patient's Cardiovascular Status Stable and Respiratory Function Stable. No residual motor block.  Post-op Vital Signs: Reviewed  Filed Vitals:   07/05/14 1200  BP: 100/66  Pulse: 54  Temp:   Resp: 11    Complications: No apparent anesthesia complications

## 2014-07-05 NOTE — Op Note (Signed)
NAME:  DUNBAR, BURAS NO.:  [id number]  MEDICAL RECORD NO.:  [id number]  LOCATION:  5N09C                        FACILITY:  [facility]  PHYSICIAN:  Anderson Malta, M.D.    DATE OF BIRTH:  [birth year]  DATE OF PROCEDURE: DATE OF DISCHARGE:                              OPERATIVE REPORT   PREOPERATIVE DIAGNOSIS:  Left knee arthritis.  POSTOPERATIVE DIAGNOSIS:  Left knee arthritis.  PROCEDURE:  Left total knee replacement.  SURGEON:  Anderson Malta, M.D.  ASSISTANT:  Laure Kidney, RNFA.  INDICATIONS:  Billy Fields is a 64 year old patient with end-stage left knee arthritis, presents for operative management after explanation of risks and benefits.  COMPONENTS UTILIZED:  Smith and Nephew posterior cruciate retaining, Triathlon total knee replacement cemented bilaterally, 6 femur, 7 tibia, 13 poly insert, 38 3-pegged poly patella.  PROCEDURE IN DETAIL:  The patient was brought to the operating room where a spinal anesthetic was induced.  Preoperative IV antibiotics were administered.  Left leg was prescrubbed with alcohol and Betadine, allowed to air dry, prepped with DuraPrep solution, and draped in a sterile manner.  Charlie Pitter was used to cover the operative field.  The patient had preop flexion contracture of 15 degrees and flexion to about 115.  Time-out was called.  Leg was elevated and exsanguinated with Esmarch wrap.  Tourniquet was inflated.  Anterior approach to the knee was made.  Skin and subcutaneous tissue were sharply divided. Anteromedial arthrotomy was made.  Precise location was marked with #1 Vicryl suture.  Patella was everted.  Fat pad partially excised. Lateral patellofemoral ligament released.  Significant synovitis was present within the knee joint.  This was removed.  Soft tissue removed in an extraperiosteal fashion off the anterior distal femur.  The patient had severe tricompartmental arthritis.  The patella was everted. Knee was flexed.   Intramedullary alignment was then used to cut the tibia initially 9 mm off the least affected lateral side. Intramedullary alignment also used to cut the femur initially 10 mm off of the distal femur, which did give an adequate resection, 9 mm spacer would not fit in the space and allowed full extension.  Therefore, 2 more mm cut off the tibia with the posterior structures and collaterals protected.  At this time, femur was sized to a size 6.  Posterior, anterior and chamfer cuts were made with the collaterals protected. Tibia keel punched to a size 7.  Trial reduction was then performed. Osteophytes were removed posteriorly.  The patient did achieve full extension with a 9 and 11-mm spacer.  Patella was then cut freehand from 28 to 17.  Three-pegged patella was placed.  At this time, trial components were placed with an 11 spacer, which gave a little bit of hyperextension, but good flexion, good stability, varus and valgus stress at 0, 30 and 90 degrees.  Trial components were removed. Thorough irrigation performed.  Exparel injected into the capsule.  The components were then cemented into position with an 11-mm spacer. Excess cement was removed.  Another trial with the 13-mm spacer gave full extension and good stability, varus and valgus stress at 0, 30 and 90 degrees.  A 13-mm spacer was  chosen primarily because of the slightly increased stability to varus and valgus stress in extension.  This matched his resection as well.  Tourniquet was released.  Bleeding points were encountered and controlled with electrocautery.  The incision was closed using #1 Vicryl suture, 0 Vicryl suture, 2-0 Vicryl suture and skin staples. Solution of Marcaine, morphine, and clonidine injected into the knee. The patient tolerated the procedure well without immediate complication. Placed in a bulky wrap.  Laure Kidney was assisted during the case.     Anderson Malta, M.D.     GSD/MEDQ  D:   07/05/2014  T:  07/05/2014  Job:  [id number]

## 2014-07-05 NOTE — Evaluation (Signed)
Physical Therapy Evaluation Patient Details Name: Billy Fields MRN: [id number] DOB: [birth year] Today's Date: 07/05/2014   History of Present Illness  64 y.o. male admitted to [facility] on 07/05/14 for elective L TKA. Pt with significant PMHx of HTN, R toe surgery, and L knee arthroscopy.    Clinical Impression  Pt is POD #0 and is moving well, min assist overall with RW for in room gait tonight.  HEP exercises given and knee precautions reviewed.  Pt will likely progress well enough to go home with wife and HHPT at discharge.   PT to follow acutely for deficits listed below.       Follow Up Recommendations Home health PT    Equipment Recommendations  None recommended by PT    Recommendations for Other Services   NA    Precautions / Restrictions Precautions Precautions: Knee Precaution Booklet Issued: Yes (comment) Precaution Comments: knee handout given, WBAT status reviewed, no pillow under surgical knee, KI use reviewed.  Required Braces or Orthoses: Knee Immobilizer - Left Knee Immobilizer - Left: Other (comment) (until d/c by MD) Restrictions Weight Bearing Restrictions: Yes LLE Weight Bearing: Weight bearing as tolerated      Mobility  Bed Mobility Overal bed mobility: Needs Assistance Bed Mobility: Supine to Sit     Supine to sit: Min assist     General bed mobility comments: Min assist to help progress left leg over EOB.   Transfers Overall transfer level: Needs assistance Equipment used: Rolling walker (2 wheeled) Transfers: Sit to/from Stand Sit to Stand: Min assist;From elevated surface         General transfer comment: Min assist to support trunk during transition to stand.  Verbal cues for safe hand placement.   Ambulation/Gait Ambulation/Gait assistance: Min assist Ambulation Distance (Feet): 8 Feet Assistive device: Rolling walker (2 wheeled) Gait Pattern/deviations: Step-to pattern;Antalgic     General Gait Details: Moderately antalgic gait  pattern, min assit to support trunk for balance, verbal cues to encourage WB on left leg as pt was tending to hop instead of step         Balance Overall balance assessment: Needs assistance Sitting-balance support: Feet supported;No upper extremity supported Sitting balance-Leahy Scale: Good     Standing balance support: Bilateral upper extremity supported Standing balance-Leahy Scale: Poor                               Pertinent Vitals/Pain Pain Assessment: 0-10 Pain Score: 5  Pain Location: left knee, stiff Pain Descriptors / Indicators: Aching;Burning Pain Intervention(s): Limited activity within patient's tolerance;Monitored during session;Repositioned    Home Living Family/patient expects to be discharged to:: Private residence Living Arrangements: Spouse/significant other Available Help at Discharge: Family;Available 24 hours/day (x 2 weeks wife took off of work) Type of Home: House Home Access: Level entry     [place]: Two level;Able to live on main level with bedroom/bathroom Home Equipment: Gilford Rile - 2 wheels;Bedside commode      Prior Function Level of Independence: Independent         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  Ankle 3/5, knee 2+/5, hip flex 2+/5  Cervical / Trunk  Assessment: Normal  Communication   Communication: No difficulties  Cognition Arousal/Alertness: Awake/alert Behavior During Therapy: WFL for tasks assessed/performed Overall Cognitive Status: Within Functional Limits for tasks assessed                      General Comments General comments (skin integrity, edema, etc.): Educated wife on donning KI    Exercises Total Joint Exercises Ankle Circles/Pumps: AROM;Both;20  reps;Supine Quad Sets: AROM;Left;10 reps;Supine Heel Slides: AAROM;Left;10 reps;Supine      Assessment/Plan    PT Assessment Patient needs continued PT services  PT Diagnosis Difficulty walking;Abnormality of gait;Generalized weakness;Acute pain   PT Problem List Decreased strength;Decreased range of motion;Decreased activity tolerance;Decreased balance;Decreased mobility;Decreased knowledge of use of DME;Decreased knowledge of precautions;Pain  PT Treatment Interventions DME instruction;Gait training;Stair training;Functional mobility training;Therapeutic activities;Therapeutic exercise;Balance training;Neuromuscular re-education;Patient/family education;Manual techniques;Modalities   PT Goals (Current goals can be found in the Care Plan section) Acute Rehab PT Goals Patient Stated Goal: to go home PT Goal Formulation: With patient/family Time For Goal Achievement: 07/12/14 Potential to Achieve Goals: Good    Frequency 7X/week           End of Session Equipment Utilized During Treatment: Left knee immobilizer Activity Tolerance: Patient limited by fatigue;Patient limited by pain Patient left: in chair;with call bell/phone within reach;with family/visitor present           Time: 7741-4239 PT Time Calculation (min) (ACUTE ONLY): 17 min   Charges:   PT Evaluation $Initial PT Evaluation Tier I: 1 Procedure PT Treatments $Therapeutic Activity: 8-22 mins        Newton Frutiger B. [place], [place], DPT [phone]   07/05/2014, 4:24 PM

## 2014-07-05 NOTE — Brief Op Note (Signed)
07/05/2014  10:43 AM  PATIENT:  Billy Fields  64 y.o. male  PRE-OPERATIVE DIAGNOSIS:  LEFT KNEE OSTEOARTHRITIS  POST-OPERATIVE DIAGNOSIS:  LEFT KNEE OSTEOARTHRITIS  PROCEDURE:  Procedure(s): TOTAL KNEE ARTHROPLASTY  SURGEON:  Surgeon(s): Meredith Pel, MD  ASSISTANT: carla bethune rnfa  ANESTHESIA:   general  EBL: 200 ml    Total I/O In: 1500 [I.V.:1500] Out: 360 [Urine:260; Blood:100]  BLOOD ADMINISTERED: none  DRAINS: none   LOCAL MEDICATIONS USED:  Marcaine mso4 clonidine exparel  SPECIMEN:  No Specimen  COUNTS:  YES  TOURNIQUET:   Total Tourniquet Time Documented: area (Left) - 103 minutes Total: area (Left) - 103 minutes   DICTATION: .Other Dictation: Dictation Number [id number]  PLAN OF CARE: Admit to inpatient   PATIENT DISPOSITION:  PACU - hemodynamically stable

## 2014-07-06 ENCOUNTER — Encounter (HOSPITAL_COMMUNITY): Payer: Self-pay | Admitting: Orthopedic Surgery

## 2014-07-06 LAB — PROTIME-INR
INR: 1.18 (ref 0.00–1.49)
Prothrombin Time: 15.1 seconds (ref 11.6–15.2)

## 2014-07-06 LAB — BASIC METABOLIC PANEL
Anion gap: 5 (ref 5–15)
BUN: 22 mg/dL (ref 6–23)
CO2: 27 mmol/L (ref 19–32)
Calcium: 8.4 mg/dL (ref 8.4–10.5)
Chloride: 102 mEq/L (ref 96–112)
Creatinine, Ser: 1.81 mg/dL — ABNORMAL HIGH (ref 0.50–1.35)
GFR calc Af Amer: 44 mL/min — ABNORMAL LOW (ref >90)
GFR calc non Af Amer: 38 mL/min — ABNORMAL LOW (ref >90)
Glucose, Bld: 110 mg/dL — ABNORMAL HIGH (ref 70–99)
Potassium: 3.8 mmol/L (ref 3.5–5.1)
Sodium: 134 mmol/L — ABNORMAL LOW (ref 135–145)

## 2014-07-06 LAB — CBC
HEMATOCRIT: 37.4 % — AB (ref 39.0–52.0)
Hemoglobin: 12.7 g/dL — ABNORMAL LOW (ref 13.0–17.0)
MCH: 29.9 pg (ref 26.0–34.0)
MCHC: 34 g/dL (ref 30.0–36.0)
MCV: 88 fL (ref 78.0–100.0)
PLATELETS: 171 10*3/uL (ref 150–400)
RBC: 4.25 MIL/uL (ref 4.22–5.81)
RDW: 12.8 % (ref 11.5–15.5)
WBC: 9.5 10*3/uL (ref 4.0–10.5)

## 2014-07-06 MED ORDER — WARFARIN SODIUM 7.5 MG PO TABS
7.5000 mg | ORAL_TABLET | Freq: Once | ORAL | Status: AC
  Administered 2014-07-06: 7.5 mg via ORAL
  Filled 2014-07-06: qty 1

## 2014-07-06 NOTE — Progress Notes (Signed)
Orthopedic Tech Progress Note Patient Details:  [name] 15-Jul-1950 [id number]  Patient ID: Billy Fields, male   DOB: [birth year], 64 y.o.   MRN: [id number] Viewed order from doctor's order list  Hildred Priest 07/06/2014, 11:09 AM

## 2014-07-06 NOTE — Plan of Care (Signed)
Problem: Consults Goal: Diagnosis- Total Joint Replacement Primary Total Knee Left     

## 2014-07-06 NOTE — Progress Notes (Signed)
Physical Therapy Treatment Patient Details Name: Billy Fields MRN: [id number] DOB: [birth year] Today's Date: 07/06/2014    History of Present Illness 64 y.o. male admitted to [facility] on 07/05/14 for elective L TKA. Pt with significant PMHx of HTN, R toe surgery, and L knee arthroscopy.      PT Comments    Pt is POD #1 progressing slowly with gait due to pain and stiffness today.  He will likely still progress well enough to return home with wife's assist at discharge. He has no steps to enter his home, so PM session today will focus on progressing gait distance and seated exercises.   Follow Up Recommendations  Home health PT     Equipment Recommendations  None recommended by PT    Recommendations for Other Services   NA     Precautions / Restrictions Precautions Precautions: Knee Precaution Booklet Issued: Yes (comment) Precaution Comments: knee handout given, WBAT status reviewed, no pillow under surgical knee, KI use reviewed.  Required Braces or Orthoses: Knee Immobilizer - Left (forgot and pt did well without during AM session) Knee Immobilizer - Left: Other (comment) (until d/c by MD) Restrictions Weight Bearing Restrictions: No LLE Weight Bearing: Weight bearing as tolerated    Mobility  Bed Mobility Overal bed mobility: Needs Assistance Bed Mobility: Supine to Sit     Supine to sit: Min assist     General bed mobility comments: Min assist of left leg to progress it to EOB.   Transfers Overall transfer level: Needs assistance Equipment used: Rolling walker (2 wheeled) Transfers: Sit to/from Stand Sit to Stand: Min guard         General transfer comment: Min guard assist for safety.  Verbal cues for safe hand placement.   Ambulation/Gait Ambulation/Gait assistance: Min guard Ambulation Distance (Feet): 35 Feet Assistive device: Rolling walker (2 wheeled) Gait Pattern/deviations: Step-to pattern;Antalgic;Trunk flexed Gait velocity: significantly slower  than 1.8 ft/sec Gait velocity interpretation: <1.8 ft/sec, indicative of risk for recurrent falls General Gait Details: Pt with very slow, stiff legged gait pattern.  We walked without KI today without signs of buckling or instability.  Pt needed verbal cues for upright posture and to look up during gait (he likes to watch his feet).          Balance Overall balance assessment: Needs assistance Sitting-balance support: Feet supported;No upper extremity supported Sitting balance-Leahy Scale: Good     Standing balance support: Bilateral upper extremity supported Standing balance-Leahy Scale: Poor                      Cognition Arousal/Alertness: Awake/alert Behavior During Therapy: WFL for tasks assessed/performed Overall Cognitive Status: Within Functional Limits for tasks assessed                      Exercises Total Joint Exercises Ankle Circles/Pumps: AROM;Both;20 reps;Supine Quad Sets: AROM;Left;10 reps;Supine Towel Squeeze: AROM;Both;10 reps;Supine Short Arc Quad: AAROM;Left;10 reps;Supine Heel Slides: AAROM;Left;10 reps;Supine Hip ABduction/ADduction: AAROM;Left;10 reps;Supine Straight Leg Raises: AAROM;Left;10 reps;Supine        Pertinent Vitals/Pain Pain Assessment: 0-10 Pain Score: 5  Pain Location: left knee Pain Descriptors / Indicators: Aching;Burning Pain Intervention(s): Limited activity within patient's tolerance;Monitored during session;Repositioned           PT Goals (current goals can now be found in the care plan section) Acute Rehab PT Goals Patient Stated Goal: to go home Progress towards PT goals: Progressing toward goals    Frequency  7X/week    PT Plan Current plan remains appropriate       End of Session   Activity Tolerance: Patient limited by fatigue;Patient limited by pain Patient left: in chair;with call bell/phone within reach;with family/visitor present;Other (comment) (in knee foam for extension)      Time: 4801-6553 PT Time Calculation (min) (ACUTE ONLY): 41 min  Charges:  $Gait Training: 8-22 mins $Therapeutic Exercise: 23-37 mins                      Allexus Ovens B. [place], [place], DPT [phone]   07/06/2014, 11:56 AM

## 2014-07-06 NOTE — Progress Notes (Signed)
Physical Therapy Treatment Patient Details Name: Billy Fields MRN: [id number] DOB: [birth year] Today's Date: 07/06/2014    History of Present Illness 64 y.o. male admitted to [facility] on 07/05/14 for elective L TKA. Pt with significant PMHx of HTN, R toe surgery, and L knee arthroscopy.      PT Comments    Pt is progressing slowly with his mobility, however, he was able to increase gait distance down the hallway.  He is at ~45 degrees of active and 50 degrees of AAROM flexion at the knee (pt is very guarded when therapist tries to push him into flexion).  He was placed in CPM at 53 degrees at the end of my session.  PT will continue to follow acutely until d/c and he is still appropriate for HHPT at discharge.   Follow Up Recommendations  Home health PT     Equipment Recommendations  None recommended by PT    Recommendations for Other Services   NA     Precautions / Restrictions Precautions Precautions: Knee Precaution Booklet Issued: Yes (comment) Precaution Comments: knee handout given, WBAT status reviewed, no pillow under surgical knee, KI use reviewed.  Required Braces or Orthoses: Knee Immobilizer - Left Knee Immobilizer - Left: Other (comment) (until d/c by MD) Restrictions LLE Weight Bearing: Weight bearing as tolerated    Mobility  Bed Mobility Overal bed mobility: Needs Assistance Bed Mobility: Sit to Supine       Sit to supine: Min assist   General bed mobility comments: Tried to get pt to lift his left leg into the bed using his right leg, but he was unable.  Min assist to support left leg during transition to supine.   Transfers Overall transfer level: Needs assistance Equipment used: Rolling walker (2 wheeled) Transfers: Sit to/from Stand Sit to Stand: Min guard         General transfer comment: Min guard assist for safety  Ambulation/Gait Ambulation/Gait assistance: Min guard Ambulation Distance (Feet): 85 Feet Assistive device: Rolling walker (2  wheeled) Gait Pattern/deviations: Step-to pattern;Antalgic;Trunk flexed Gait velocity: decreased Gait velocity interpretation: <1.8 ft/sec, indicative of risk for recurrent falls General Gait Details: Pt continues to have very slow gait pattern.  KI used this PM (according to orders).  Pt reports arm fatigue before leg pain/fatigue, so I asked if he could put lighter pressure on his hands during gait, but was unable.  Verbal cues for upright posture and relaxed shoulders during gait.           Balance Overall balance assessment: Needs assistance Sitting-balance support: Feet supported;No upper extremity supported Sitting balance-Leahy Scale: Good     Standing balance support: Bilateral upper extremity supported Standing balance-Leahy Scale: Fair                      Cognition Arousal/Alertness: Lethargic;Suspect due to medications Behavior During Therapy: [facility] for tasks assessed/performed Overall Cognitive Status: Within Functional Limits for tasks assessed                      Exercises Total Joint Exercises Long Arc Quad: AAROM;Left;10 reps;Seated Knee Flexion: AROM;AAROM;Left;20 reps;Seated        Pertinent Vitals/Pain Pain Assessment: 0-10 Pain Score: 5  Pain Location: left knee Pain Descriptors / Indicators: Aching;Burning Pain Intervention(s): Limited activity within patient's tolerance;Monitored during session;Repositioned           PT Goals (current goals can now be found in the care plan section) Acute Rehab  PT Goals Patient Stated Goal: to go home Progress towards PT goals: Progressing toward goals    Frequency  7X/week    PT Plan Current plan remains appropriate       End of Session Equipment Utilized During Treatment: Left knee immobilizer Activity Tolerance: Patient limited by fatigue;Patient limited by pain Patient left: in bed;in CPM;with call bell/phone within reach;with family/visitor present     Time: 3845-3646 PT Time  Calculation (min) (ACUTE ONLY): 38 min  Charges:  $Gait Training: 23-37 mins $Therapeutic Exercise: 8-22 mins                      Billy Fields B. [place], [place], DPT [phone]   07/06/2014, 5:15 PM

## 2014-07-06 NOTE — Evaluation (Signed)
Occupational Therapy Evaluation Patient Details Name: Billy Fields MRN: [id number] DOB: [birth year] Today's Date: 07/06/2014    History of Present Illness 64 y.o. male admitted to [facility] on 07/05/14 for elective L TKA. Pt with significant PMHx of HTN, R toe surgery, and L knee arthroscopy.     Clinical Impression   Pt admitted with above. He demonstrates the below listed deficits and will benefit from continued OT to maximize safety and independence with BADLs.  Pt requires min guard to min A with functional transfers, max A for LB ADLs - will will assist, but have encouraged him to make concerted effort daily to access feet for bathing and dressing. Will follow.       Follow Up Recommendations  No OT follow up;Supervision/Assistance - 24 hour    Equipment Recommendations  None recommended by OT    Recommendations for Other Services       Precautions / Restrictions Precautions Precautions: Knee Precaution Booklet Issued: Yes (comment) Precaution Comments: PT issued handout Required Braces or Orthoses: Knee Immobilizer - Left Knee Immobilizer - Left: Other (comment) (until d/c by MD) Restrictions Weight Bearing Restrictions: No LLE Weight Bearing: Weight bearing as tolerated      Mobility Bed Mobility Overal bed mobility: Needs Assistance Bed Mobility: Supine to Sit     Supine to sit: Min assist Sit to supine: Min assist   General bed mobility comments: Min A for Lt. LE.   Pt dependent on use of trapeze   Transfers Overall transfer level: Needs assistance Equipment used: Rolling walker (2 wheeled) Transfers: Sit to/from [facility] Sit to Stand: Min guard Stand pivot transfers: Min guard       General transfer comment: min guard for safety     Balance Overall balance assessment: Needs assistance Sitting-balance support: Feet supported;No upper extremity supported Sitting balance-Leahy Scale: Good     Standing balance support: During  functional activity Standing balance-Leahy Scale: Fair                              ADL Overall ADL's : Needs assistance/impaired Eating/Feeding: Independent   Grooming: Wash/dry hands;Wash/dry face;Oral care;Brushing hair;Min guard;Standing   Upper Body Bathing: Set up;Supervision/ safety;Sitting   Lower Body Bathing: Moderate assistance;Sit to/from stand   Upper Body Dressing : Set up;Sitting   Lower Body Dressing: Maximal assistance Lower Body Dressing Details (indicate cue type and reason): Pt able to reach forward to ankle, but unable to doff sock.  Encouraged pt to attempt LB dressing everyday instead of automatically spouse to do it for him as it facillitates increased knee flexion  Toilet Transfer: Minimal assistance;Ambulation;Comfort height toilet;BSC;RW;Grab bars Toilet Transfer Details (indicate cue type and reason): Pt with LOB ambulating into bathroom  Toileting- Clothing Manipulation and Hygiene: Moderate assistance;Sit to/from stand       Functional mobility during ADLs: Min guard;Minimal assistance;Rolling walker       Vision                     Perception     Praxis      Pertinent Vitals/Pain Pain Assessment: No/denies pain Pain Score: 5  Pain Location: left knee Pain Descriptors / Indicators: Aching;Burning Pain Intervention(s): Limited activity within patient's tolerance;Monitored during session;Repositioned     Hand Dominance     Extremity/Trunk Assessment Upper Extremity Assessment Upper Extremity Assessment: Overall WFL for tasks assessed   Lower Extremity Assessment Lower Extremity Assessment: Defer to  PT evaluation   Cervical / Trunk Assessment Cervical / Trunk Assessment: Normal   Communication     Cognition Arousal/Alertness: Awake/alert Behavior During Therapy: WFL for tasks assessed/performed Overall Cognitive Status: Within Functional Limits for tasks assessed                     General Comments        Exercises Exercises: Total Joint     Shoulder Instructions      Home Living Family/patient expects to be discharged to:: Private residence Living Arrangements: Spouse/significant other Available Help at Discharge: Family;Available 24 hours/day Type of Home: House Home Access: Level entry     Home Layout: Two level;Able to live on main level with bedroom/bathroom     Bathroom Shower/Tub: Occupational psychologist: Handicapped height Bathroom Accessibility: Yes How Accessible: Accessible via walker Home Equipment: [place] - 2 wheels;Bedside commode;Shower seat - built in          Prior Functioning/Environment Level of Independence: Independent        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    OT Diagnosis: Generalized weakness;Acute pain   OT Problem List: Decreased activity tolerance;Decreased knowledge of use of DME or AE;Decreased knowledge of precautions;Pain   OT Treatment/Interventions: Self-care/ADL training;DME and/or AE instruction;Therapeutic activities;Patient/family education    OT Goals(Current goals can be found in the care plan section) Acute Rehab OT Goals Patient Stated Goal: to go home OT Goal Formulation: With patient Time For Goal Achievement: 07/13/14 Potential to Achieve Goals: Good ADL Goals Pt Will Perform Grooming: with supervision;standing Pt Will Perform Lower Body Bathing: with supervision;sit to/from stand;with adaptive equipment Pt Will Perform Lower Body Dressing: with supervision;with adaptive equipment;sit to/from stand Pt Will Transfer to Toilet: with supervision;ambulating;regular height toilet;bedside commode;grab bars Pt Will Perform Toileting - Clothing Manipulation and hygiene: with supervision;sit to/from stand Pt Will Perform Tub/Shower Transfer: Shower transfer;with supervision;ambulating;shower seat;rolling walker  OT Frequency: Min 2X/week   Barriers to D/C:            Co-evaluation               End of Session Equipment Utilized During Treatment: Rolling walker;Left knee immobilizer CPM Left Knee CPM Left Knee: Off Nurse Communication: Mobility status  Activity Tolerance: Patient tolerated treatment well Patient left: in chair;with call bell/phone within reach   Time: 1530-1551 OT Time Calculation (min): 21 min Charges:  OT General Charges $OT Visit: 1 Procedure OT Evaluation $Initial OT Evaluation Tier I: 1 Procedure OT Treatments $Self Care/Home Management : 8-22 mins G-Codes:    Lucille Passy M 08/04/2014, 5:31 PM

## 2014-07-06 NOTE — Progress Notes (Signed)
ANTICOAGULATION CONSULT NOTE -follow up Pharmacy Consult for Coumadin Indication: VTE prophylaxis  No Known Allergies  Patient Measurements: Height: 6' (182.9 cm) Weight: 231 lb (104.781 kg) IBW/kg (Calculated) : 77.6  Vital Signs: Temp: 97.6 F (36.4 C) (01/13 0500) Temp Source: Oral (01/13 0500) BP: 87/58 mmHg (01/13 0500) Pulse Rate: 82 (01/13 0500)  Labs:  Recent Labs  07/06/14 0545  HGB 12.7*  HCT 37.4*  PLT 171  LABPROT 15.1  INR 1.18  CREATININE 1.81*    Estimated Creatinine Clearance: 51.6 mL/min (by C-G formula based on Cr of 1.81).  Assessment: L TKA 64 y/o M s/p L TKA on warfarin for VTE prophx.  Baseline INR 1.05. Baseline Hgb 16.5. Plts 198. Scr 1.26 with CrCl 74. No anticoagulation PTA other than ASA 81mg /day.  INR 1.18 after first dose of 6 mg coumadin.  Coumadin score = 6. Creatinine up to 1.81. Post-op ABLA, Hg 16.5>12.7.   Goal of Therapy:  INR 2-3   Plan:  Coumadin 7.5 mg po x 1 tonight. Daily INR Education completed with patient and all questions answered  Eudelia Bunch, Pharm.D. [phone] 07/06/2014 1:39 PM

## 2014-07-06 NOTE — Progress Notes (Signed)
Orthopedic Tech Progress Note Patient Details:  Billy Fields 1950-10-02 [id number]  CPM Left Knee Additional Comments: trapeze bar patient helper Physical therapist Wells Guiles will put pt in cpm when she finishes with his physical therapy  Hildred Priest 07/06/2014, 11:07 AM

## 2014-07-06 NOTE — Discharge Instructions (Signed)
Information on my medicine - Coumadin   (Warfarin)  This medication education was reviewed with me or my healthcare representative as part of my discharge preparation.  The pharmacist that spoke with me during my hospital stay was:  Eudelia Bunch, [facility]  Why was Coumadin prescribed for you? Coumadin was prescribed for you because you have a blood clot or a medical condition that can cause an increased risk of forming blood clots. Blood clots can cause serious health problems by blocking the flow of blood to the heart, lung, or brain. Coumadin can prevent harmful blood clots from forming. As a reminder your indication for Coumadin is:   Blood Clot Prevention After Orthopedic Surgery  What test will check on my response to Coumadin? While on Coumadin (warfarin) you will need to have an INR test regularly to ensure that your dose is keeping you in the desired range. The INR (international normalized ratio) number is calculated from the result of the laboratory test called prothrombin time (PT).  If an INR APPOINTMENT HAS NOT ALREADY BEEN MADE FOR YOU please schedule an appointment to have this lab work done by your health care provider within 7 days. Your INR goal is usually a number between:  2 to 3 or your provider may give you a more narrow range like 2-2.5.  Ask your health care provider during an office visit what your goal INR is.  What  do you need to  know  About  COUMADIN? Take Coumadin (warfarin) exactly as prescribed by your healthcare provider about the same time each day.  DO NOT stop taking without talking to the doctor who prescribed the medication.  Stopping without other blood clot prevention medication to take the place of Coumadin may increase your risk of developing a new clot or stroke.  Get refills before you run out.  What do you do if you miss a dose? If you miss a dose, take it as soon as you remember on the same day then continue your regularly scheduled regimen the next  day.  Do not take two doses of Coumadin at the same time.  Important Safety Information A possible side effect of Coumadin (Warfarin) is an increased risk of bleeding. You should call your healthcare provider right away if you experience any of the following: ? Bleeding from an injury or your nose that does not stop. ? Unusual colored urine (red or dark brown) or unusual colored stools (red or black). ? Unusual bruising for unknown reasons. ? A serious fall or if you hit your head (even if there is no bleeding).  Some foods or medicines interact with Coumadin (warfarin) and might alter your response to warfarin. To help avoid this: ? Eat a balanced diet, maintaining a consistent amount of Vitamin K. ? Notify your provider about major diet changes you plan to make. ? Avoid alcohol or limit your intake to 1 drink for women and 2 drinks for men per day. (1 drink is 5 oz. wine, 12 oz. beer, or 1.5 oz. liquor.)  Make sure that ANY health care provider who prescribes medication for you knows that you are taking Coumadin (warfarin).  Also make sure the healthcare provider who is monitoring your Coumadin knows when you have started a new medication including herbals and non-prescription products.  Coumadin (Warfarin)  Major Drug Interactions  Increased Warfarin Effect Decreased Warfarin Effect  Alcohol (large quantities) Antibiotics (esp. Septra/Bactrim, Flagyl, Cipro) Amiodarone (Cordarone) Aspirin (ASA) Cimetidine (Tagamet) Megestrol (Megace) NSAIDs (ibuprofen,  naproxen, etc.) °Piroxicam (Feldene) °Propafenone (Rythmol SR) °Propranolol (Inderal) °Isoniazid (INH) °Posaconazole (Noxafil) Barbiturates (Phenobarbital) °Carbamazepine (Tegretol) °Chlordiazepoxide (Librium) °Cholestyramine (Questran) °Griseofulvin °Oral Contraceptives °Rifampin °Sucralfate (Carafate) °Vitamin K  ° °Coumadin® (Warfarin) Major Herbal Interactions  °Increased Warfarin Effect Decreased Warfarin Effect   °Garlic °Ginseng °Ginkgo biloba Coenzyme Q10 °Green tea °St. John’s wort   ° °Coumadin® (Warfarin) FOOD Interactions  °Eat a consistent number of servings per week of foods HIGH in Vitamin K °(1 serving = ½ cup)  °Collards (cooked, or boiled & drained) °Kale (cooked, or boiled & drained) °Mustard greens (cooked, or boiled & drained) °Parsley *serving size only = ¼ cup °Spinach (cooked, or boiled & drained) °Swiss chard (cooked, or boiled & drained) °Turnip greens (cooked, or boiled & drained)  °Eat a consistent number of servings per week of foods MEDIUM-HIGH in Vitamin K °(1 serving = 1 cup)  °Asparagus (cooked, or boiled & drained) °Broccoli (cooked, boiled & drained, or raw & chopped) °Brussel sprouts (cooked, or boiled & drained) *serving size only = ½ cup °Lettuce, raw (green leaf, endive, romaine) °Spinach, raw °Turnip greens, raw & chopped  ° °These websites have more information on Coumadin (warfarin):  www.coumadin.com; °www.ahrq.gov/consumer/coumadin.htm; ° ° °

## 2014-07-06 NOTE — Progress Notes (Signed)
Subjective: Pt stable pain ok - no cpm yet hosp ran out   Objective: Vital signs in last 24 hours: Temp:  [97 F (36.1 C)-98.2 F (36.8 C)] 97.6 F (36.4 C) (01/13 0500) Pulse Rate:  [54-87] 82 (01/13 0500) Resp:  [11-22] 18 (01/13 0500) BP: (87-121)/(45-77) 87/58 mmHg (01/13 0500) SpO2:  [82 %-100 %] 94 % (01/13 0500)  Intake/Output from previous day: 01/12 0701 - 01/13 0700 In: 2881.3 [P.O.:720; I.V.:2161.3] Out: 660 [Urine:560; Blood:100] Intake/Output this shift:    Exam:  Sensation intact distally Intact pulses distally Dorsiflexion/Plantar flexion intact  Labs:  Recent Labs  07/06/14 0545  HGB 12.7*    Recent Labs  07/06/14 0545  WBC 9.5  RBC 4.25  HCT 37.4*  PLT 171    Recent Labs  07/06/14 0545  NA 134*  K 3.8  CL 102  CO2 27  BUN 22  CREATININE 1.81*  GLUCOSE 110*  CALCIUM 8.4    Recent Labs  07/06/14 0545  INR 1.18    Assessment/Plan: Mobilize with PT and CPM - labs ok   DEAN,GREGORY [place] 07/06/2014, 8:59 AM

## 2014-07-07 LAB — CBC
HCT: 35.4 % — ABNORMAL LOW (ref 39.0–52.0)
Hemoglobin: 12.1 g/dL — ABNORMAL LOW (ref 13.0–17.0)
MCH: 30.6 pg (ref 26.0–34.0)
MCHC: 34.2 g/dL (ref 30.0–36.0)
MCV: 89.4 fL (ref 78.0–100.0)
PLATELETS: 158 10*3/uL (ref 150–400)
RBC: 3.96 MIL/uL — ABNORMAL LOW (ref 4.22–5.81)
RDW: 12.9 % (ref 11.5–15.5)
WBC: 9.7 10*3/uL (ref 4.0–10.5)

## 2014-07-07 LAB — PROTIME-INR
INR: 1.73 — ABNORMAL HIGH (ref 0.00–1.49)
Prothrombin Time: 20.4 seconds — ABNORMAL HIGH (ref 11.6–15.2)

## 2014-07-07 MED ORDER — METHOCARBAMOL 500 MG PO TABS: 500.0000 mg | ORAL_TABLET | Freq: Four times a day (QID) | ORAL | Status: DC | PRN

## 2014-07-07 MED ORDER — WARFARIN SODIUM 2.5 MG PO TABS
2.5000 mg | ORAL_TABLET | Freq: Once | ORAL | Status: AC
  Administered 2014-07-07: 2.5 mg via ORAL
  Filled 2014-07-07: qty 1

## 2014-07-07 MED ORDER — OXYCODONE HCL 5 MG PO TABS: 5.0000 mg | ORAL_TABLET | ORAL | Status: DC | PRN

## 2014-07-07 MED ORDER — WARFARIN SODIUM 5 MG PO TABS: 5.0000 mg | ORAL_TABLET | Freq: Every day | ORAL | Status: DC

## 2014-07-07 NOTE — Progress Notes (Signed)
07/07/14 Spoke with patient and his wife about HHC. Per wife MD office set up [facility] with [facility]. Contacted Yanice Maqueda at [facility], they do not work with his insurance so they will not be able to work with him. Patient and wife chose [facility]. Contacted Miranda at [facility] and set up [facility] due to patient on coumadin.[employer] providing CPM, rolling walker and 3N1. Wife will be available to assist patient at home. Fuller Plan RN, BSN, CCM

## 2014-07-07 NOTE — Progress Notes (Signed)
Orthopedic Tech Progress Note Patient Details:  Billy Fields [birth year] [id number]  Patient ID: Billy Fields, male   DOB: 09-01-1950, 64 y.o.   MRN: [id number] Placed pt's lle in cpm @ 0-50 degrees @1420 ; will increase as pt tolerates; RN notified  Hildred Priest 07/07/2014, 2:17 PM

## 2014-07-07 NOTE — Progress Notes (Signed)
Subjective: Pt stable - pain ok   Objective: Vital signs in last 24 hours: Temp:  [98 F (36.7 C)-99 F (37.2 C)] 98 F (36.7 C) (01/14 0701) Pulse Rate:  [92-102] 94 (01/14 0701) Resp:  [18] 18 (01/13 1429) BP: (112-117)/(58-63) 115/58 mmHg (01/14 0701) SpO2:  [93 %-97 %] 93 % (01/14 0208)  Intake/Output from previous day: 01/13 0701 - 01/14 0700 In: 1320 [P.O.:1320] Out: -  Intake/Output this shift: Total I/O In: 240 [P.O.:240] Out: -   Exam:  Neurovascular intact Sensation intact distally Intact pulses distally  Labs:  Recent Labs  07/06/14 0545 07/07/14 0439  HGB 12.7* 12.1*    Recent Labs  07/06/14 0545 07/07/14 0439  WBC 9.5 9.7  RBC 4.25 3.96*  HCT 37.4* 35.4*  PLT 171 158    Recent Labs  07/06/14 0545  NA 134*  K 3.8  CL 102  CO2 27  BUN 22  CREATININE 1.81*  GLUCOSE 110*  CALCIUM 8.4    Recent Labs  07/06/14 0545 07/07/14 0439  INR 1.18 1.73*    Assessment/Plan: Plan dc am doing ok on cpm   Billy Fields 07/07/2014, 11:32 AM

## 2014-07-07 NOTE — Discharge Summary (Signed)
Physician Discharge Summary  Patient ID: Billy Fields MRN: [id number] DOB/AGE: [birth year] 65 y.o.  Admit date: 07/05/2014 Discharge date: 07/08/2014  Admission Diagnoses:  Active Problems:   Arthritis of knee   Discharge Diagnoses:  Same  Surgeries: Procedure(s): TOTAL KNEE ARTHROPLASTY on 07/05/2014   Consultants:    Discharged Condition: Stable  Hospital Course: Billy Fields is an 64 y.o. male who was admitted 07/05/2014 with a chief complaint of left knee pain, and found to have a diagnosis of left knee arthritis.  They were brought to the operating room on 07/05/2014 and underwent the above named procedures.  He tolerated procedure well and was mobilized with physical therapy and CPM on postoperative day #1. He made gradual but steady progress in rehabilitation and movement. He is discharged home on postop day #3. He will follow-up with me in 10 days for suture removal. Patient placed on Coumadin for DVT prophylaxis and CPM machine for continued motion along with home health care.  Antibiotics given:  Anti-infectives    Start     Dose/Rate Route Frequency Ordered Stop   07/05/14 1800  ceFAZolin (ANCEF) IVPB 2 g/50 mL premix     2 g100 mL/hr over 30 Minutes Intravenous 3 times per day 07/05/14 1648 07/06/14 0607   07/05/14 0600  ceFAZolin (ANCEF) IVPB 2 g/50 mL premix     2 g100 mL/hr over 30 Minutes Intravenous On call to O.R. 07/04/14 1357 07/05/14 0735    .  Recent vital signs:  Filed Vitals:   07/07/14 1949  BP: 97/58  Pulse: 86  Temp: 98.5 F (36.9 C)  Resp: 18    Recent laboratory studies:  Results for orders placed or performed during the hospital encounter of 07/05/14  Protime-INR  Result Value Ref Range   Prothrombin Time 15.1 11.6 - 15.2 seconds   INR 1.18 0.00 - 1.49  CBC  Result Value Ref Range   WBC 9.5 4.0 - 10.5 K/uL   RBC 4.25 4.22 - 5.81 MIL/uL   Hemoglobin 12.7 (L) 13.0 - 17.0 g/dL   HCT 37.4 (L) 39.0 - 52.0 %   MCV 88.0 78.0 - 100.0 fL    MCH 29.9 26.0 - 34.0 pg   MCHC 34.0 30.0 - 36.0 g/dL   RDW 12.8 11.5 - 15.5 %   Platelets 171 150 - 400 K/uL  Basic metabolic panel  Result Value Ref Range   Sodium 134 (L) 135 - 145 mmol/L   Potassium 3.8 3.5 - 5.1 mmol/L   Chloride 102 96 - 112 mEq/L   CO2 27 19 - 32 mmol/L   Glucose, Bld 110 (H) 70 - 99 mg/dL   BUN 22 6 - 23 mg/dL   Creatinine, Ser 1.81 (H) 0.50 - 1.35 mg/dL   Calcium 8.4 8.4 - 10.5 mg/dL   GFR calc non Af Amer 38 (L) >90 mL/min   GFR calc Af Amer 44 (L) >90 mL/min   Anion gap 5 5 - 15  Protime-INR  Result Value Ref Range   Prothrombin Time 20.4 (H) 11.6 - 15.2 seconds   INR 1.73 (H) 0.00 - 1.49  CBC  Result Value Ref Range   WBC 9.7 4.0 - 10.5 K/uL   RBC 3.96 (L) 4.22 - 5.81 MIL/uL   Hemoglobin 12.1 (L) 13.0 - 17.0 g/dL   HCT 35.4 (L) 39.0 - 52.0 %   MCV 89.4 78.0 - 100.0 fL   MCH 30.6 26.0 - 34.0 pg   MCHC 34.2 30.0 - 36.0  g/dL   RDW 12.9 11.5 - 15.5 %   Platelets 158 150 - 400 K/uL    Discharge Medications:     Medication List    STOP taking these medications        naproxen sodium 220 MG tablet  Commonly known as:  ANAPROX      TAKE these medications        aspirin EC 81 MG tablet  Take 81 mg by mouth daily.     atorvastatin 10 MG tablet  Commonly known as:  LIPITOR  Take 10 mg by mouth daily at 6 PM.     methocarbamol 500 MG tablet  Commonly known as:  ROBAXIN  Take 1 tablet (500 mg total) by mouth every 6 (six) hours as needed for muscle spasms.     multivitamin with minerals Tabs tablet  Take 1 tablet by mouth daily.     oxyCODONE 5 MG immediate release tablet  Commonly known as:  Oxy IR/ROXICODONE  Take 1-2 tablets (5-10 mg total) by mouth every 3 (three) hours as needed for breakthrough pain.     valsartan-hydrochlorothiazide 320-25 MG per tablet  Commonly known as:  DIOVAN-HCT  Take 1 tablet by mouth daily.     warfarin 5 MG tablet  Commonly known as:  COUMADIN  Take 1 tablet (5 mg total) by mouth daily.         Diagnostic Studies: Dg Chest 2 View  06/22/2014   CLINICAL DATA:  Preoperative total knee arthroplasty.  Hypertension  EXAM: CHEST  2 VIEW  COMPARISON:  None.  FINDINGS: There is a degree of underlying emphysematous change. There is no edema or consolidation. The heart size and pulmonary vascularity are normal. No adenopathy. There is mild degenerative change in the thoracic spine.  IMPRESSION: There is a degree of underlying emphysematous change. No edema or consolidation.   Electronically Signed   By: Lowella Grip M.D.   On: 06/22/2014 09:18    Disposition: Final discharge disposition not confirmed      Discharge Instructions    Call MD / Call 911    Complete by:  As directed   If you experience chest pain or shortness of breath, CALL 911 and be transported to the hospital emergency room.  If you develope a fever above 101 F, pus (white drainage) or increased drainage or redness at the wound, or calf pain, call your surgeon's office.     Constipation Prevention    Complete by:  As directed   Drink plenty of fluids.  Prune juice may be helpful.  You may use a stool softener, such as Colace (over the counter) 100 mg twice a day.  Use MiraLax (over the counter) for constipation as needed.     Diet - low sodium heart healthy    Complete by:  As directed      Discharge instructions    Complete by:  As directed   Keep incision dry Weightbearing as tolerated left leg with knee immobilizer in place CPM minimum 6 hours per day increase 5 daily goal is 90 of flexion but first postop visit Remove dressing on Tuesday okay to shower at that time but no submersion of the knee under water     Increase activity slowly as tolerated    Complete by:  As directed            Follow-up Information    Follow up with [facility].   Why:  They will contact  you to schedule home physical therapy and nurse visits.   Contact information:   [address] [place]  [address] [phone]        Signed: SYAIRE, SABER 07/07/2014, 9:33 PM

## 2014-07-07 NOTE — Progress Notes (Signed)
Physical Therapy Treatment Patient Details Name: Billy Fields MRN: [id number] DOB: 03/20/1951 Today's Date: 07/07/2014    History of Present Illness 64 y.o. male admitted to [facility] on 07/05/14 for elective L TKA. Pt with significant PMHx of HTN, R toe surgery, and L knee arthroscopy.      PT Comments    Pt is progressing well with gait distance with RW.  We are attempting to work on upright posture during gait, decreased reliance on hands and heel to toe step through gait pattern.  He continues to struggle with knee flexion ROM progression.  He doesn't push himself into pain during his flexion exercises and when therapist pushes him aggressively into flexion he guards the motion.  I talked with him about how aggressive the home therapist is going to have to be with his bending (he then asked if it was more aggressive than me-YES) and that it would benefit him if he would pre medicate before the home therapy sessions.  I explained that scar tissue forms quickly and we need to get his flexion motion back as soon as possible.  He verbalized understanding, but I am concerned about his flexion ROM.   Follow Up Recommendations  Home health PT     Equipment Recommendations  None recommended by PT    Recommendations for Other Services   NA     Precautions / Restrictions Precautions Precautions: Knee Precaution Booklet Issued: Yes (comment) Required Braces or Orthoses: Knee Immobilizer - Left Knee Immobilizer - Left: Other (comment) (until d/c by MD) Restrictions Weight Bearing Restrictions: No LLE Weight Bearing: Weight bearing as tolerated    Mobility  Bed Mobility Overal bed mobility: Needs Assistance Bed Mobility: Sit to Supine;Supine to Sit     Supine to sit: Min assist Sit to supine: Min assist   General bed mobility comments: Min assist to help progress leg into and out of bed.   Transfers Overall transfer level: Needs assistance Equipment used: Rolling walker (2  wheeled) Transfers: Sit to/from Stand Sit to Stand: Min guard         General transfer comment: Min guard assit for safety during transitions.    Ambulation/Gait Ambulation/Gait assistance: Min guard Ambulation Distance (Feet): 100 Feet Assistive device: Rolling walker (2 wheeled) Gait Pattern/deviations: Step-to pattern;Ataxic;Trunk flexed Gait velocity: decreased Gait velocity interpretation: <1.8 ft/sec, indicative of risk for recurrent falls General Gait Details: Pt with moderately antalgic, flexed knee gait pattern.  Verbal cues to attempt good heel to toe gait pattern.  By end of gait pt was able to start to initiate a step through pattern.           Balance Overall balance assessment: Needs assistance Sitting-balance support: Feet supported;No upper extremity supported Sitting balance-Leahy Scale: Good     Standing balance support: Bilateral upper extremity supported;No upper extremity supported;Single extremity supported Standing balance-Leahy Scale: Fair                      Cognition Arousal/Alertness: Awake/alert Behavior During Therapy: WFL for tasks assessed/performed Overall Cognitive Status: Within Functional Limits for tasks assessed                      Exercises Total Joint Exercises Knee Flexion: AROM;AAROM;Left;20 reps;Seated Goniometric ROM: 10-40 AAROM in sitting.          Pertinent Vitals/Pain Pain Assessment: 0-10 Pain Score: 3  Pain Location: left knee Pain Descriptors / Indicators: Aching;Burning Pain Intervention(s): Limited activity within patient's  tolerance;Monitored during session;Repositioned           PT Goals (current goals can now be found in the care plan section) Acute Rehab PT Goals Patient Stated Goal: to go home Progress towards PT goals: Progressing toward goals    Frequency  7X/week    PT Plan Current plan remains appropriate       End of Session Equipment Utilized During Treatment: Left  knee immobilizer Activity Tolerance: Patient limited by fatigue;Patient limited by pain Patient left: in bed;with call bell/phone within reach     Time: 5456-2563 PT Time Calculation (min) (ACUTE ONLY): 52 min  Charges:  $Gait Training: 23-37 mins $Therapeutic Exercise: 8-22 mins $Therapeutic Activity: 8-22 mins                      Billy Fields, [place], DPT [phone]   07/07/2014, 4:15 PM

## 2014-07-07 NOTE — Progress Notes (Signed)
Physical Therapy Treatment Patient Details Name: Billy Fields MRN: [id number] DOB: 08-18-1950 Today's Date: 07/07/2014    History of Present Illness 64 y.o. male admitted to [facility] on 07/05/14 for elective L TKA. Pt with significant PMHx of HTN, R toe surgery, and L knee arthroscopy.      PT Comments    Pt has had a busy morning and is open to bed exercises, but wants to defer gait into the hallway until PM session.  He continues to have limited knee flexion even with therapist's active assist he is only ~50 degrees of flexion.  He is very guarded at end ROM.  PT will continue to follow acutely and plan for hallway gait and seated exercises this PM.    Follow Up Recommendations  Home health PT     Equipment Recommendations  None recommended by PT    Recommendations for Other Services   NA     Precautions / Restrictions Precautions Precautions: Knee Precaution Booklet Issued: Yes (comment) Precaution Comments: handout given and knee precautions reviewed Required Braces or Orthoses: Knee Immobilizer - Left Knee Immobilizer - Left: Other (comment) (until d/c by MD) Restrictions LLE Weight Bearing: Weight bearing as tolerated          Cognition Arousal/Alertness: Awake/alert Behavior During Therapy: WFL for tasks assessed/performed Overall Cognitive Status: Within Functional Limits for tasks assessed                      Exercises Total Joint Exercises Ankle Circles/Pumps: AROM;Both;20 reps;Supine Quad Sets: AROM;Left;10 reps;Supine Towel Squeeze: AROM;Both;10 reps;Supine Short Arc Quad: AAROM;Left;10 reps;Supine Heel Slides: AAROM;Left;10 reps;Supine Hip ABduction/ADduction: AAROM;Left;10 reps;Supine Straight Leg Raises: AAROM;Left;10 reps;Supine        Pertinent Vitals/Pain Pain Assessment: 0-10 Pain Score: 4  Pain Location: left knee Pain Descriptors / Indicators: Aching;Burning Pain Intervention(s): Limited activity within patient's tolerance;Monitored  during session;Repositioned;Other (comment) (RN offered pain meds, he declined needing them during sessio)           PT Goals (current goals can now be found in the care plan section) Acute Rehab PT Goals Patient Stated Goal: to go home Progress towards PT goals: Progressing toward goals    Frequency  7X/week    PT Plan Current plan remains appropriate       End of Session   Activity Tolerance: Patient limited by fatigue;Patient limited by pain Patient left: in bed;with call bell/phone within reach;with nursing/sitter in room     Time: 1100-1122 PT Time Calculation (min) (ACUTE ONLY): 22 min  Charges:  $Therapeutic Exercise: 8-22 mins                     Londell Noll B. [place], [place], DPT [phone]   07/07/2014, 11:30 AM

## 2014-07-07 NOTE — Progress Notes (Signed)
ANTICOAGULATION CONSULT NOTE -follow up Pharmacy Consult for Coumadin Indication: VTE prophylaxis  No Known Allergies  Patient Measurements: Height: 6' (182.9 cm) Weight: 231 lb (104.781 kg) IBW/kg (Calculated) : 77.6  Vital Signs: Temp: 98 F (36.7 C) (01/14 0701) Temp Source: Oral (01/14 0701) BP: 115/58 mmHg (01/14 0701) Pulse Rate: 94 (01/14 0701)  Labs:  Recent Labs  07/06/14 0545 07/07/14 0439  HGB 12.7* 12.1*  HCT 37.4* 35.4*  PLT 171 158  LABPROT 15.1 20.4*  INR 1.18 1.73*  CREATININE 1.81*  --     Estimated Creatinine Clearance: 51.6 mL/min (by C-G formula based on Cr of 1.81).  Assessment: L TKA 64 y/o M s/p L TKA on warfarin for VTE prophx.  Baseline INR 1.05. Baseline Hgb 16.5. Plts 198. Scr 1.26 with CrCl 74. No anticoagulation PTA other than ASA 81mg /day.  INR 1.18> 1.73 after 6 mg x1 then 7.5mg  x1 coumadin.  Coumadin score = 6. Creatinine up to 1.81. Post-op ABLA, Hg 16.5>12.7.   Goal of Therapy:  INR 2-3   Plan:  Coumadin 2.5 mg po x 1 tonight. Daily INR Education completed with patient and all questions answered  Bonnita Nasuti Pharm.D. CPP, BCPS Clinical Pharmacist [phone] 07/07/2014 10:14 AM

## 2014-07-08 LAB — CBC
HCT: 32.1 % — ABNORMAL LOW (ref 39.0–52.0)
HEMOGLOBIN: 11 g/dL — AB (ref 13.0–17.0)
MCH: 29.9 pg (ref 26.0–34.0)
MCHC: 34.3 g/dL (ref 30.0–36.0)
MCV: 87.2 fL (ref 78.0–100.0)
PLATELETS: 157 10*3/uL (ref 150–400)
RBC: 3.68 MIL/uL — AB (ref 4.22–5.81)
RDW: 12.7 % (ref 11.5–15.5)
WBC: 8.5 10*3/uL (ref 4.0–10.5)

## 2014-07-08 LAB — PROTIME-INR
INR: 1.78 — AB (ref 0.00–1.49)
Prothrombin Time: 20.8 seconds — ABNORMAL HIGH (ref 11.6–15.2)

## 2014-07-08 NOTE — Progress Notes (Signed)
Physical Therapy Treatment Patient Details Name: Billy Fields MRN: [id number] DOB: 1951/03/23 Today's Date: 07/08/2014    History of Present Illness 64 y.o. male admitted to [facility] on 07/05/14 for elective L TKA. Pt with significant PMHx of HTN, R toe surgery, and L knee arthroscopy.      PT Comments    Pt is limited in gait distance today by pain and soreness.  I continued to emphasize the need to push himself into flexion (this will be uncomfortable, and if it is not, you need to push it more until it is).  He continues to hover in the mid 78s with knee flexion ROM.  Less guarding today (softer end feel).  Wife reinforced that Dr. Marlou Sa wants him to be at 80 degrees in the CPM before his next office visit. PT will continue to follow acutely until d/c.  D/C planned for later today.   Follow Up Recommendations  Home health PT     Equipment Recommendations  None recommended by PT    Recommendations for Other Services   NA     Precautions / Restrictions Precautions Precautions: Knee Precaution Booklet Issued: Yes (comment) Precaution Comments: handout given and knee precautions reviewed Required Braces or Orthoses: Knee Immobilizer - Left Knee Immobilizer - Left: Other (comment) (until d/c by MD) Restrictions LLE Weight Bearing: Weight bearing as tolerated    Mobility  Transfers Overall transfer level: Needs assistance Equipment used: Rolling walker (2 wheeled) Transfers: Sit to/from Stand Sit to Stand: Min guard         General transfer comment: Min guard assit for safety during transitions  Ambulation/Gait Ambulation/Gait assistance: Min guard Ambulation Distance (Feet): 60 Feet Assistive device: Rolling walker (2 wheeled) Gait Pattern/deviations: Step-to pattern;Antalgic;Trunk flexed Gait velocity: decreased Gait velocity interpretation: Below normal speed for age/gender General Gait Details: Pt with slow, moderately antaligic gait pattern.  Attempting last 30' of  gait heel to toe pattern with small step through.  Verbal cues for uprigt posture during gait.           Balance Overall balance assessment: Needs assistance Sitting-balance support: Feet supported;No upper extremity supported Sitting balance-Leahy Scale: Good     Standing balance support: Bilateral upper extremity supported;No upper extremity supported;Single extremity supported Standing balance-Leahy Scale: Fair                      Cognition Arousal/Alertness: Awake/alert Behavior During Therapy: WFL for tasks assessed/performed Overall Cognitive Status: Within Functional Limits for tasks assessed                      Exercises Total Joint Exercises Ankle Circles/Pumps: AROM;Both;20 reps;Supine Quad Sets: AROM;Left;10 reps;Supine Towel Squeeze: AROM;Both;10 reps;Supine Short Arc Quad: AAROM;Left;10 reps;Supine Heel Slides: AAROM;Left;10 reps;Supine Hip ABduction/ADduction: AAROM;Left;10 reps;Supine Straight Leg Raises: AAROM;Left;10 reps;Supine        Pertinent Vitals/Pain Pain Assessment: 0-10 Pain Score: 6  Pain Location: left knee Pain Descriptors / Indicators: Aching;Burning Pain Intervention(s): Limited activity within patient's tolerance;Monitored during session;Repositioned           PT Goals (current goals can now be found in the care plan section) Acute Rehab PT Goals Patient Stated Goal: to go home Progress towards PT goals: Progressing toward goals    Frequency  7X/week    PT Plan Current plan remains appropriate       End of Session Equipment Utilized During Treatment: Left knee immobilizer Activity Tolerance: Patient limited by fatigue;Patient limited by pain Patient  left: in chair;with call bell/phone within reach;Other (comment);with family/visitor present (in zero foam for extension)     Time: 7289-7915 PT Time Calculation (min) (ACUTE ONLY): 40 min  Charges:  $Gait Training: 8-22 mins $Therapeutic Exercise: 23-37  mins                      Zakira Ressel B. [place], [place], DPT [phone]   07/08/2014, 9:02 AM

## 2014-07-08 NOTE — Progress Notes (Signed)
Pt stable Moving in room Dc today remove ace wrap before dc

## 2014-07-08 NOTE — Progress Notes (Signed)
ANTICOAGULATION CONSULT NOTE - Follow Up Consult  Pharmacy Consult for coumadin Indication: VTE prophylaxis  No Known Allergies  Patient Measurements: Height: 6' (182.9 cm) Weight: 231 lb (104.781 kg) IBW/kg (Calculated) : 77.6 Heparin Dosing Weight:   Vital Signs: Temp: 98.6 F (37 C) (01/15 0457) BP: 106/58 mmHg (01/15 0513) Pulse Rate: 92 (01/15 0457)  Labs:  Recent Labs  07/06/14 0545 07/07/14 0439 07/08/14 0535  HGB 12.7* 12.1* 11.0*  HCT 37.4* 35.4* 32.1*  PLT 171 158 157  LABPROT 15.1 20.4* 20.8*  INR 1.18 1.73* 1.78*  CREATININE 1.81*  --   --     Estimated Creatinine Clearance: 51.6 mL/min (by C-G formula based on Cr of 1.81).   Medications:  Scheduled:  . atorvastatin  10 mg Oral q1800  . coumadin book   Does not apply Once  . irbesartan  300 mg Oral Daily   And  . hydrochlorothiazide  25 mg Oral Daily  . multivitamin with minerals  1 tablet Oral Daily  . warfarin   Does not apply Once  . Warfarin - Pharmacist Dosing Inpatient   Does not apply q1800   Infusions:    Assessment: 64 yo male s/p L TKA is currently on subtherapeutic coumadin for VTE prophylaxis.  INR is stable at 1.78 after 2.5 mg of coumadin last night.    Goal of Therapy:  INR 2-3 Monitor platelets by anticoagulation protocol: Yes   Plan:  Coumadin 5 mg po x 1 tonight. Will prob be discharged today.If d/c, take coumadin 5mg  po daily for now and rec to have INR recheck on Monday to reassess dosing Daily INR if still here  Billy Fields, Tsz-Yin 07/08/2014,9:34 AM

## 2014-07-08 NOTE — Progress Notes (Signed)
Lolita Lenz discharged home per MD order. Discharge instructions reviewed and discussed with patient. All questions and concerns answered. Copy of instructions and scripts given to patient. IV removed.  Patient escorted to car by staff in a wheelchair. No distress noted upon discharge.   Delson, [place] R 07/08/2014 11:52 AM

## 2014-08-25 ENCOUNTER — Encounter: Payer: Self-pay | Admitting: Orthopedic Surgery

## 2015-10-18 DIAGNOSIS — H5213 Myopia, bilateral: Secondary | ICD-10-CM | POA: Diagnosis not present

## 2015-10-18 DIAGNOSIS — H2513 Age-related nuclear cataract, bilateral: Secondary | ICD-10-CM | POA: Diagnosis not present

## 2015-10-18 DIAGNOSIS — H532 Diplopia: Secondary | ICD-10-CM | POA: Diagnosis not present

## 2015-10-18 DIAGNOSIS — H35372 Puckering of macula, left eye: Secondary | ICD-10-CM | POA: Diagnosis not present

## 2015-11-03 ENCOUNTER — Ambulatory Visit (INDEPENDENT_AMBULATORY_CARE_PROVIDER_SITE_OTHER): Payer: Medicare Other | Admitting: Podiatry

## 2015-11-03 ENCOUNTER — Ambulatory Visit (INDEPENDENT_AMBULATORY_CARE_PROVIDER_SITE_OTHER): Payer: Medicare Other

## 2015-11-03 ENCOUNTER — Encounter: Payer: Self-pay | Admitting: Podiatry

## 2015-11-03 VITALS — BP 131/7 | HR 82 | Resp 12

## 2015-11-03 DIAGNOSIS — M79672 Pain in left foot: Secondary | ICD-10-CM | POA: Diagnosis not present

## 2015-11-03 DIAGNOSIS — M722 Plantar fascial fibromatosis: Secondary | ICD-10-CM

## 2015-11-03 MED ORDER — TRIAMCINOLONE ACETONIDE 10 MG/ML IJ SUSP
10.0000 mg | Freq: Once | INTRAMUSCULAR | Status: AC
  Administered 2015-11-03: 10 mg

## 2015-11-03 NOTE — Progress Notes (Signed)
   Subjective:    Patient ID: Billy Fields, male    DOB: 05/01/1951, 65 y.o.   MRN: [id number]  HPI Chief Complaint  Patient presents with  . Plantar Fasciitis    ''LT FOOT HEEL IS SORE.''    PT STATED LT BOTTOM OF THE HEEL IS BEEN HURTING FOR 5 WEEKS. FOOT GET WORSE WHEN WALKING. TRIED ICE -NO RELIEF  Review of Systems  Musculoskeletal: Positive for gait problem.  All other systems reviewed and are negative.      Objective:   Physical Exam        Assessment & Plan:

## 2015-11-03 NOTE — Progress Notes (Signed)
Subjective:     Patient ID: ULA YINGER, male   DOB: 09-14-1950, 65 y.o.   MRN: [id number]  HPI patient states I developed a lot of pain in the bottom of my left heel that's been going on for several months and worse over the last 5 weeks. It's worse when I get up in the morning or after periods of sitting and I've tried ice and to wear orthotics   Review of Systems  All other systems reviewed and are negative.      Objective:   Physical Exam  Constitutional: He is oriented to person, place, and time.  Cardiovascular: Intact distal pulses.   Musculoskeletal: Normal range of motion.  Neurological: He is oriented to person, place, and time.  Skin: Skin is warm.  Nursing note and vitals reviewed.  neurovascular status intact muscle strength adequate range of motion within normal limits with patient found to have exquisite discomfort plantar aspect left heel at the insertional point of the tendon into the calcaneus with inflammation and fluid around the medial band. Patient's found to have a high arch structure and is found to have good digital perfusion and is well oriented 3     Assessment:     Acute plantar fasciitis left with inflammation and fluid around the medial band    Plan:     H&P and conditions reviewed with patient. Today I injected the fascia 3 mg Kenalog 5 mill grams Xylocaine and applied fascial brace gave instructions on physical therapy and reappoint 2 weeks. Also instructed on shoe gear modifications  X-rays indicate small plantar spur and large posterior spur formation left

## 2015-11-03 NOTE — Patient Instructions (Signed)
Plantar Fasciitis Plantar fasciitis is a painful foot condition that affects the heel. It occurs when the band of tissue that connects the toes to the heel bone (plantar fascia) becomes irritated. This can happen after exercising too much or doing other repetitive activities (overuse injury). The pain from plantar fasciitis can range from mild irritation to severe pain that makes it difficult for you to walk or move. The pain is usually worse in the morning or after you have been sitting or lying down for a while. CAUSES This condition may be caused by:  Standing for long periods of time.  Wearing shoes that do not fit.  Doing high-impact activities, including running, aerobics, and ballet.  Being overweight.  Having an abnormal way of walking (gait).  Having tight calf muscles.  Having high arches in your feet.  Starting a new athletic activity. SYMPTOMS The main symptom of this condition is heel pain. Other symptoms include:  Pain that gets worse after activity or exercise.  Pain that is worse in the morning or after resting.  Pain that goes away after you walk for a few minutes. DIAGNOSIS This condition may be diagnosed based on your signs and symptoms. Your health care provider will also do a physical exam to check for:  A tender area on the bottom of your foot.  A high arch in your foot.  Pain when you move your foot.  Difficulty moving your foot. You may also need to have imaging studies to confirm the diagnosis. These can include:  X-rays.  Ultrasound.  MRI. TREATMENT  Treatment for plantar fasciitis depends on the severity of the condition. Your treatment may include:  Rest, ice, and over-the-counter pain medicines to manage your pain.  Exercises to stretch your calves and your plantar fascia.  A splint that holds your foot in a stretched, upward position while you sleep (night splint).  Physical therapy to relieve symptoms and prevent problems in the  future.  Cortisone injections to relieve severe pain.  Extracorporeal shock wave therapy (ESWT) to stimulate damaged plantar fascia with electrical impulses. It is often used as a last resort before surgery.  Surgery, if other treatments have not worked after 12 months. HOME CARE INSTRUCTIONS  Take medicines only as directed by your health care provider.  Avoid activities that cause pain.  Roll the bottom of your foot over a bag of ice or a bottle of cold water. Do this for 20 minutes, 3-4 times a day.  Perform simple stretches as directed by your health care provider.  Try wearing athletic shoes with air-sole or gel-sole cushions or soft shoe inserts.  Wear a night splint while sleeping, if directed by your health care provider.  Keep all follow-up appointments with your health care provider. PREVENTION   Do not perform exercises or activities that cause heel pain.  Consider finding low-impact activities if you continue to have problems.  Lose weight if you need to. The best way to prevent plantar fasciitis is to avoid the activities that aggravate your plantar fascia. SEEK MEDICAL CARE IF:  Your symptoms do not go away after treatment with home care measures.  Your pain gets worse.  Your pain affects your ability to move or do your daily activities.   This information is not intended to replace advice given to you by your health care provider. Make sure you discuss any questions you have with your health care provider.   Document Released: 03/05/2001 Document Revised: 03/01/2015 Document Reviewed: 04/20/2014 Elsevier   Interactive Patient Education 2016 [employer] Inc.  

## 2015-11-16 ENCOUNTER — Encounter: Payer: Self-pay | Admitting: Podiatry

## 2015-11-16 ENCOUNTER — Ambulatory Visit (INDEPENDENT_AMBULATORY_CARE_PROVIDER_SITE_OTHER): Payer: Medicare Other | Admitting: Podiatry

## 2015-11-16 DIAGNOSIS — M722 Plantar fascial fibromatosis: Secondary | ICD-10-CM

## 2015-11-16 NOTE — Patient Instructions (Signed)

## 2015-11-16 NOTE — Progress Notes (Signed)
Subjective:     Patient ID: Billy Fields, male   DOB: June 02, 1951, 65 y.o.   MRN: [id number]  HPI patient states left foot is doing quite good with minimal discomfort upon palpation   Review of Systems     Objective:   Physical Exam Neurovascular status intact muscle strength adequate with significant reduction of pain left plantar heel    Assessment:     Doing much better from plantar fasciitis left    Plan:     Advised on physical therapy anti-inflammatories and supportive therapy and patient will be seen back to recheck

## 2015-11-27 DIAGNOSIS — N183 Chronic kidney disease, stage 3 (moderate): Secondary | ICD-10-CM | POA: Diagnosis not present

## 2015-11-27 DIAGNOSIS — Z79899 Other long term (current) drug therapy: Secondary | ICD-10-CM | POA: Diagnosis not present

## 2015-11-27 DIAGNOSIS — E7800 Pure hypercholesterolemia, unspecified: Secondary | ICD-10-CM | POA: Diagnosis not present

## 2015-11-27 DIAGNOSIS — Z125 Encounter for screening for malignant neoplasm of prostate: Secondary | ICD-10-CM | POA: Diagnosis not present

## 2015-12-01 DIAGNOSIS — N183 Chronic kidney disease, stage 3 (moderate): Secondary | ICD-10-CM | POA: Diagnosis not present

## 2015-12-01 DIAGNOSIS — I10 Essential (primary) hypertension: Secondary | ICD-10-CM | POA: Diagnosis not present

## 2015-12-01 DIAGNOSIS — E7800 Pure hypercholesterolemia, unspecified: Secondary | ICD-10-CM | POA: Diagnosis not present

## 2015-12-01 DIAGNOSIS — Z79899 Other long term (current) drug therapy: Secondary | ICD-10-CM | POA: Diagnosis not present

## 2016-01-02 DIAGNOSIS — L570 Actinic keratosis: Secondary | ICD-10-CM | POA: Diagnosis not present

## 2016-01-02 DIAGNOSIS — D0359 Melanoma in situ of other part of trunk: Secondary | ICD-10-CM | POA: Diagnosis not present

## 2016-01-02 DIAGNOSIS — D225 Melanocytic nevi of trunk: Secondary | ICD-10-CM | POA: Diagnosis not present

## 2016-01-02 DIAGNOSIS — D485 Neoplasm of uncertain behavior of skin: Secondary | ICD-10-CM | POA: Diagnosis not present

## 2016-01-02 DIAGNOSIS — C44519 Basal cell carcinoma of skin of other part of trunk: Secondary | ICD-10-CM | POA: Diagnosis not present

## 2016-01-11 DIAGNOSIS — L988 Other specified disorders of the skin and subcutaneous tissue: Secondary | ICD-10-CM | POA: Diagnosis not present

## 2016-01-11 DIAGNOSIS — D0359 Melanoma in situ of other part of trunk: Secondary | ICD-10-CM | POA: Diagnosis not present

## 2016-01-11 DIAGNOSIS — D225 Melanocytic nevi of trunk: Secondary | ICD-10-CM | POA: Diagnosis not present

## 2016-01-11 DIAGNOSIS — C4359 Malignant melanoma of other part of trunk: Secondary | ICD-10-CM | POA: Diagnosis not present

## 2016-04-05 DIAGNOSIS — Z23 Encounter for immunization: Secondary | ICD-10-CM | POA: Diagnosis not present

## 2016-05-28 DIAGNOSIS — E7800 Pure hypercholesterolemia, unspecified: Secondary | ICD-10-CM | POA: Diagnosis not present

## 2016-05-28 DIAGNOSIS — Z79899 Other long term (current) drug therapy: Secondary | ICD-10-CM | POA: Diagnosis not present

## 2016-05-28 DIAGNOSIS — N183 Chronic kidney disease, stage 3 (moderate): Secondary | ICD-10-CM | POA: Diagnosis not present

## 2016-06-04 DIAGNOSIS — Z125 Encounter for screening for malignant neoplasm of prostate: Secondary | ICD-10-CM | POA: Diagnosis not present

## 2016-06-04 DIAGNOSIS — E7800 Pure hypercholesterolemia, unspecified: Secondary | ICD-10-CM | POA: Diagnosis not present

## 2016-06-04 DIAGNOSIS — Z0000 Encounter for general adult medical examination without abnormal findings: Secondary | ICD-10-CM | POA: Diagnosis not present

## 2016-06-04 DIAGNOSIS — Z79899 Other long term (current) drug therapy: Secondary | ICD-10-CM | POA: Diagnosis not present

## 2016-06-21 DIAGNOSIS — Z23 Encounter for immunization: Secondary | ICD-10-CM | POA: Diagnosis not present

## 2016-07-15 DIAGNOSIS — D485 Neoplasm of uncertain behavior of skin: Secondary | ICD-10-CM | POA: Diagnosis not present

## 2016-07-15 DIAGNOSIS — D2272 Melanocytic nevi of left lower limb, including hip: Secondary | ICD-10-CM | POA: Diagnosis not present

## 2016-07-15 DIAGNOSIS — L570 Actinic keratosis: Secondary | ICD-10-CM | POA: Diagnosis not present

## 2016-07-15 DIAGNOSIS — Z85820 Personal history of malignant melanoma of skin: Secondary | ICD-10-CM | POA: Diagnosis not present

## 2016-07-15 DIAGNOSIS — L812 Freckles: Secondary | ICD-10-CM | POA: Diagnosis not present

## 2016-07-15 DIAGNOSIS — D225 Melanocytic nevi of trunk: Secondary | ICD-10-CM | POA: Diagnosis not present

## 2016-07-15 DIAGNOSIS — L821 Other seborrheic keratosis: Secondary | ICD-10-CM | POA: Diagnosis not present

## 2016-07-15 DIAGNOSIS — D2271 Melanocytic nevi of right lower limb, including hip: Secondary | ICD-10-CM | POA: Diagnosis not present

## 2016-11-13 ENCOUNTER — Encounter (INDEPENDENT_AMBULATORY_CARE_PROVIDER_SITE_OTHER): Payer: Self-pay | Admitting: Orthopedic Surgery

## 2016-11-13 ENCOUNTER — Ambulatory Visit (INDEPENDENT_AMBULATORY_CARE_PROVIDER_SITE_OTHER): Payer: Medicare Other | Admitting: Orthopedic Surgery

## 2016-11-13 ENCOUNTER — Ambulatory Visit (INDEPENDENT_AMBULATORY_CARE_PROVIDER_SITE_OTHER): Payer: Medicare Other

## 2016-11-13 DIAGNOSIS — M79644 Pain in right finger(s): Secondary | ICD-10-CM

## 2016-11-13 NOTE — Progress Notes (Signed)
Office Visit Note   Patient: Billy Fields           Date of Birth: 10-Dec-1950           MRN: [id number] Visit Date: 11/13/2016 Requested by: Derinda Late, MD [address] [facility] [place],  [address] PCP: Derinda Late, MD  Subjective: Chief Complaint  Patient presents with  . Finger Injury    HPI: Billy Fields is a 66 year old patient with right index finger pain.  Denies any history of injury.  Still bothering her for months.  Localizes the pain primarily at the MCP joint and PIP joint.  Denies a history of trauma.  Pain is worse with activity.  Her some depressed down with the finger.  Takes occasional Aleve.  His left knee is doing well.              ROS: All systems reviewed are negative as they relate to the chief complaint within the history of present illness.  Patient denies  fevers or chills.   Assessment & Plan: Visit Diagnoses:  1. 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  No history of stomach arthritis or gout.  Radiographs are confirmatory.  Plan would be injection if necessary but for now he is going to try some samples of topical anti-inflammatory.  If that works well enough he may consider paying out of pocket for the topical anti-inflammatory this is current insurance does not cover it.  Follow-Up Instructions: No Follow-up on file.   Orders:  Orders Placed This Encounter  Procedures  . XR Finger Index Right   No orders of the defined types were placed in this encounter.     Procedures: No procedures performed   Clinical Data: No additional findings.  Objective: Vital Signs: There were no vitals taken for this visit.  Physical Exam:   Constitutional: Patient appears well-developed HEENT:  Head: Normocephalic Eyes:EOM are normal Neck: Normal range of motion Cardiovascular: Normal rate Pulmonary/chest: Effort  normal Neurologic: Patient is alert Skin: Skin is warm Psychiatric: Patient has normal mood and affect    Ortho Exam: Orthopedic exam demonstrates full wrist range of motion.  5 out of 5 grip EPL FPL interosseous wrist flexion-extension biceps triceps and upper strength.  Finger range of motion is complete but slightly stiffer on the right than the left.  He can make a fist.  Collaterals are stable.  Extensor mechanism is intact to the index finger.  Collaterals are stable at PIP and MCP joint of the index finger.  Slightly more swelling is present in the tissue between the MCP and PIP joints of the right index compared to the left index no triggering is noted no tenderness over the A1 pulley is present  Specialty Comments:  No specialty comments available.  Imaging: Xr Finger Index Right  Result Date: 11/13/2016 2 views right index finger reviewed AP and lateral.  Degenerative arthritis is present at the MCP joint.  This is worse compared to the third finger MCP joint.  No other fractures or deformity noted.  Arthritis is present at the DIP joints on the index finger.    PMFS History: Patient Active Problem List   Diagnosis Date Noted  . Degenerative arthritis of knee 07/05/2014   Past Medical History:  Diagnosis Date  . Arthritis   . GERD (gastroesophageal reflux disease)   . Hypertension  Family History  Problem Relation Age of Onset  . Heart attack Mother   . Cirrhosis Father   . CAD Father     Past Surgical History:  Procedure Laterality Date  . COLONOSCOPY    . KNEE ARTHROSCOPY Left   . TOE SURGERY Right   . TONSILLECTOMY    . TOTAL KNEE ARTHROPLASTY Left 07/05/2014   Procedure: TOTAL KNEE ARTHROPLASTY;  Surgeon: Meredith Pel, MD;  Location: [place];  Service: Orthopedics;  Laterality: Left;  Marland Kitchen VASECTOMY     Social History   Occupational History  . Not on file.   Social History Main Topics  . Smoking status: Former Smoker    Quit date: 03/27/1998  .  Smokeless tobacco: Never Used  . Alcohol use No  . Drug use: No  . Sexual activity: Not on file

## 2016-12-05 DIAGNOSIS — I10 Essential (primary) hypertension: Secondary | ICD-10-CM | POA: Diagnosis not present

## 2016-12-05 DIAGNOSIS — E782 Mixed hyperlipidemia: Secondary | ICD-10-CM | POA: Diagnosis not present

## 2016-12-05 DIAGNOSIS — M170 Bilateral primary osteoarthritis of knee: Secondary | ICD-10-CM | POA: Diagnosis not present

## 2016-12-06 DIAGNOSIS — E782 Mixed hyperlipidemia: Secondary | ICD-10-CM | POA: Diagnosis not present

## 2016-12-06 DIAGNOSIS — I10 Essential (primary) hypertension: Secondary | ICD-10-CM | POA: Diagnosis not present

## 2017-01-06 DIAGNOSIS — L570 Actinic keratosis: Secondary | ICD-10-CM | POA: Diagnosis not present

## 2017-01-06 DIAGNOSIS — L821 Other seborrheic keratosis: Secondary | ICD-10-CM | POA: Diagnosis not present

## 2017-01-06 DIAGNOSIS — D225 Melanocytic nevi of trunk: Secondary | ICD-10-CM | POA: Diagnosis not present

## 2017-01-06 DIAGNOSIS — D2272 Melanocytic nevi of left lower limb, including hip: Secondary | ICD-10-CM | POA: Diagnosis not present

## 2017-01-06 DIAGNOSIS — Z85820 Personal history of malignant melanoma of skin: Secondary | ICD-10-CM | POA: Diagnosis not present

## 2017-01-30 DIAGNOSIS — I10 Essential (primary) hypertension: Secondary | ICD-10-CM | POA: Diagnosis not present

## 2017-02-03 ENCOUNTER — Ambulatory Visit (INDEPENDENT_AMBULATORY_CARE_PROVIDER_SITE_OTHER): Payer: Medicare Other | Admitting: Podiatry

## 2017-02-03 ENCOUNTER — Encounter: Payer: Self-pay | Admitting: Podiatry

## 2017-02-03 ENCOUNTER — Ambulatory Visit: Payer: Medicare Other

## 2017-02-03 ENCOUNTER — Other Ambulatory Visit: Payer: Self-pay | Admitting: Podiatry

## 2017-02-03 ENCOUNTER — Ambulatory Visit (INDEPENDENT_AMBULATORY_CARE_PROVIDER_SITE_OTHER): Payer: Medicare Other

## 2017-02-03 DIAGNOSIS — M79671 Pain in right foot: Secondary | ICD-10-CM

## 2017-02-03 DIAGNOSIS — S92301A Fracture of unspecified metatarsal bone(s), right foot, initial encounter for closed fracture: Secondary | ICD-10-CM

## 2017-02-03 DIAGNOSIS — R600 Localized edema: Secondary | ICD-10-CM

## 2017-02-05 DIAGNOSIS — H35372 Puckering of macula, left eye: Secondary | ICD-10-CM | POA: Diagnosis not present

## 2017-02-05 DIAGNOSIS — H532 Diplopia: Secondary | ICD-10-CM | POA: Diagnosis not present

## 2017-02-05 DIAGNOSIS — H2513 Age-related nuclear cataract, bilateral: Secondary | ICD-10-CM | POA: Diagnosis not present

## 2017-02-05 DIAGNOSIS — H5213 Myopia, bilateral: Secondary | ICD-10-CM | POA: Diagnosis not present

## 2017-02-05 NOTE — Progress Notes (Signed)
Subjective:    Patient ID: Lolita Lenz, male   DOB: 66 y.o.   MRN: [id number]   HPI patient states he fell on the right side of his foot and developed a significant fracture and it's been swollen and hard to walk on. Occurred approximately 5 days ago    ROS      Objective:  Physical Exam neurovascular status intact negative Homans sign was noted with moderate to severe edema in the midfoot area right with pain centered around fifth metatarsal base and shaft for fracture occurred     Assessment:    Significant fracture of the shaft of the fifth metatarsal right with displacement     Plan:    H&P condition reviewed with patient and also discussed with Dr. Jacqualyn Posey and Dr. March Rummage. Wear on agreement that even though it is a significant fracture with a lot of separation that we should be able to avoid surgery and I reviewed this with patient and I did offer him surgical intervention and he denies wanting at currently. I went ahead and applied an Unna boot Ace wrap to reduce swelling and dispensed an air fracture walker and a surgical shoe for wearing while driving. He will be seen back to recheck 3 weeks or earlier if needed  X-rays indicate that there is separation of the fifth metatarsal shaft right with a comminuted fracture

## 2017-02-26 ENCOUNTER — Encounter: Payer: Self-pay | Admitting: Podiatry

## 2017-02-26 ENCOUNTER — Ambulatory Visit (INDEPENDENT_AMBULATORY_CARE_PROVIDER_SITE_OTHER): Payer: Medicare Other

## 2017-02-26 ENCOUNTER — Ambulatory Visit (INDEPENDENT_AMBULATORY_CARE_PROVIDER_SITE_OTHER): Payer: Medicare Other | Admitting: Podiatry

## 2017-02-26 DIAGNOSIS — S92301D Fracture of unspecified metatarsal bone(s), right foot, subsequent encounter for fracture with routine healing: Secondary | ICD-10-CM

## 2017-02-26 NOTE — Progress Notes (Signed)
Subjective:    Patient ID: Billy Fields, male   DOB: 66 y.o.   MRN: [id number]   HPI patient presents stating he's having minimal discomfort with his right foot and he's able to wear the boot and shoe as needed    ROS      Objective:  Physical Exam neurovascular status unchanged with patient having right foot mild discomfort on the lateral side with continued swelling which has improved from previous visit     Assessment:    Significant fracture the fifth metatarsal shaft right     Plan:    Reviewed again that this may ultimately require surgery but we have seen these heel and I did show this to several other physicians in the group who agree and at this point we are getting continue with immobilization with boot surgical shoe and compression elevation. Reappoint to recheck 4 weeks or earlier if needed  Patient presents with significant oblique fracture of the right fifth metatarsal

## 2017-03-26 ENCOUNTER — Ambulatory Visit (INDEPENDENT_AMBULATORY_CARE_PROVIDER_SITE_OTHER): Payer: Medicare Other | Admitting: Podiatry

## 2017-03-26 ENCOUNTER — Ambulatory Visit (INDEPENDENT_AMBULATORY_CARE_PROVIDER_SITE_OTHER): Payer: Medicare Other

## 2017-03-26 ENCOUNTER — Encounter: Payer: Self-pay | Admitting: Podiatry

## 2017-03-26 DIAGNOSIS — S92301D Fracture of unspecified metatarsal bone(s), right foot, subsequent encounter for fracture with routine healing: Secondary | ICD-10-CM

## 2017-03-26 NOTE — Progress Notes (Signed)
Subjective:    Patient ID: Billy Fields, male   DOB: 66 y.o.   MRN: [id number]   HPI patient states not having a lot of pain and swelling and they're certain shoes I can't wear but I'm able to be relatively active    ROS      Objective:  Physical Exam neurovascular status intact with mild edema lateral side right foot that's improved quite nicely with minimal discomfort when I palpated with mild pain only upon deep palpation     Assessment:    Doing well with fracture right but so far not showing significant x-ray healing     Plan:    H&P and I explained this to patient that healing is still not commencing on x-ray but clinically he is doing very well. He wants to continue to wait and he understands someday may require fixation if symptoms become chronic or worse  X-rays indicate there is still quite a bit of gapping on the oblique view but hopefully over time healing will occur this fracture we will reevaluate again in 2 months

## 2017-03-27 DIAGNOSIS — H25812 Combined forms of age-related cataract, left eye: Secondary | ICD-10-CM | POA: Diagnosis not present

## 2017-03-27 DIAGNOSIS — H2512 Age-related nuclear cataract, left eye: Secondary | ICD-10-CM | POA: Diagnosis not present

## 2017-05-26 ENCOUNTER — Ambulatory Visit (INDEPENDENT_AMBULATORY_CARE_PROVIDER_SITE_OTHER): Payer: Medicare Other

## 2017-05-26 ENCOUNTER — Ambulatory Visit (INDEPENDENT_AMBULATORY_CARE_PROVIDER_SITE_OTHER): Payer: Medicare Other | Admitting: Podiatry

## 2017-05-26 ENCOUNTER — Encounter: Payer: Self-pay | Admitting: Podiatry

## 2017-05-26 DIAGNOSIS — S92301D Fracture of unspecified metatarsal bone(s), right foot, subsequent encounter for fracture with routine healing: Secondary | ICD-10-CM

## 2017-05-26 NOTE — Progress Notes (Signed)
Subjective:   Patient ID: Billy Fields, male   DOB: 66 y.o.   MRN: [id number]   HPI Patient presents stating my right foot is doing a lot better with minimal discomfort or swelling and I am back to normal activities   ROS      Objective:  Physical Exam  Neurovascular status intact with patient's right lateral foot found to be significantly improved upon palpation with minimal edema in the shaft and only mild discomfort with palpation     Assessment:  Fracture of the fifth metatarsal shaft right thick clinically as he     Plan:  X-rays reviewed with patient and advised on return to normal activities.  Patient will be seen back as needed and hopefully will have uneventful remaining process.  X-rays indicate there is excellent healing from the AP and lateral view with still some separation on the oblique view but I think this is more to the x-ray view itself

## 2017-06-27 NOTE — Progress Notes (Signed)
Mr Billy Fields received his flu shot on 06/20/2017 at the [facility].   Lot # [id number] NDC: [phone] Mfg: [employer] Expires: 12/21/17 LT deltoid

## 2017-07-03 DIAGNOSIS — Z0000 Encounter for general adult medical examination without abnormal findings: Secondary | ICD-10-CM | POA: Diagnosis not present

## 2017-07-03 DIAGNOSIS — E782 Mixed hyperlipidemia: Secondary | ICD-10-CM | POA: Diagnosis not present

## 2017-07-03 DIAGNOSIS — Z23 Encounter for immunization: Secondary | ICD-10-CM | POA: Diagnosis not present

## 2017-07-03 DIAGNOSIS — I10 Essential (primary) hypertension: Secondary | ICD-10-CM | POA: Diagnosis not present

## 2017-07-03 DIAGNOSIS — Z125 Encounter for screening for malignant neoplasm of prostate: Secondary | ICD-10-CM | POA: Diagnosis not present

## 2017-07-03 DIAGNOSIS — Z1159 Encounter for screening for other viral diseases: Secondary | ICD-10-CM | POA: Diagnosis not present

## 2017-07-21 DIAGNOSIS — Z85820 Personal history of malignant melanoma of skin: Secondary | ICD-10-CM | POA: Diagnosis not present

## 2017-07-21 DIAGNOSIS — D2272 Melanocytic nevi of left lower limb, including hip: Secondary | ICD-10-CM | POA: Diagnosis not present

## 2017-07-21 DIAGNOSIS — D225 Melanocytic nevi of trunk: Secondary | ICD-10-CM | POA: Diagnosis not present

## 2017-07-21 DIAGNOSIS — D1801 Hemangioma of skin and subcutaneous tissue: Secondary | ICD-10-CM | POA: Diagnosis not present

## 2017-07-21 DIAGNOSIS — Z85828 Personal history of other malignant neoplasm of skin: Secondary | ICD-10-CM | POA: Diagnosis not present

## 2017-07-21 DIAGNOSIS — D2271 Melanocytic nevi of right lower limb, including hip: Secondary | ICD-10-CM | POA: Diagnosis not present

## 2017-07-21 DIAGNOSIS — L570 Actinic keratosis: Secondary | ICD-10-CM | POA: Diagnosis not present

## 2017-07-21 DIAGNOSIS — D485 Neoplasm of uncertain behavior of skin: Secondary | ICD-10-CM | POA: Diagnosis not present

## 2017-07-21 DIAGNOSIS — C44612 Basal cell carcinoma of skin of right upper limb, including shoulder: Secondary | ICD-10-CM | POA: Diagnosis not present

## 2017-07-21 DIAGNOSIS — L821 Other seborrheic keratosis: Secondary | ICD-10-CM | POA: Diagnosis not present

## 2017-07-21 DIAGNOSIS — L812 Freckles: Secondary | ICD-10-CM | POA: Diagnosis not present

## 2017-12-10 ENCOUNTER — Ambulatory Visit (INDEPENDENT_AMBULATORY_CARE_PROVIDER_SITE_OTHER): Payer: Medicare Other

## 2017-12-10 ENCOUNTER — Ambulatory Visit (INDEPENDENT_AMBULATORY_CARE_PROVIDER_SITE_OTHER): Payer: Medicare Other | Admitting: Orthopedic Surgery

## 2017-12-10 ENCOUNTER — Encounter (INDEPENDENT_AMBULATORY_CARE_PROVIDER_SITE_OTHER): Payer: Self-pay | Admitting: Orthopedic Surgery

## 2017-12-10 DIAGNOSIS — M25561 Pain in right knee: Secondary | ICD-10-CM

## 2017-12-10 DIAGNOSIS — M1711 Unilateral primary osteoarthritis, right knee: Secondary | ICD-10-CM

## 2017-12-13 ENCOUNTER — Encounter (INDEPENDENT_AMBULATORY_CARE_PROVIDER_SITE_OTHER): Payer: Self-pay | Admitting: Orthopedic Surgery

## 2017-12-13 DIAGNOSIS — M25561 Pain in right knee: Secondary | ICD-10-CM | POA: Diagnosis not present

## 2017-12-13 DIAGNOSIS — M1711 Unilateral primary osteoarthritis, right knee: Secondary | ICD-10-CM | POA: Diagnosis not present

## 2017-12-13 MED ORDER — BUPIVACAINE HCL 0.25 % IJ SOLN
4.0000 mL | INTRAMUSCULAR | Status: AC | PRN
  Administered 2017-12-13: 4 mL via INTRA_ARTICULAR

## 2017-12-13 MED ORDER — LIDOCAINE HCL 1 % IJ SOLN
5.0000 mL | INTRAMUSCULAR | Status: AC | PRN
  Administered 2017-12-13: 5 mL

## 2017-12-13 MED ORDER — METHYLPREDNISOLONE ACETATE 40 MG/ML IJ SUSP
40.0000 mg | INTRAMUSCULAR | Status: AC | PRN
  Administered 2017-12-13: 40 mg via INTRA_ARTICULAR

## 2017-12-13 NOTE — Progress Notes (Signed)
Office Visit Note   Patient: Billy Fields           Date of Birth: [birth year]           MRN: [id number] Visit Date: 12/10/2017 Requested by: [facility] [address] [place],  [address] PCP: [facility], [place] @ [facility]  Subjective: Chief Complaint  Patient presents with  . Right Knee - Pain    HPI: Billy Fields is a patient with right knee pain and swelling.'s been ongoing for several months. de Scribes decreased range of motion due to the swelling and pain.  Denies any mechanical symptoms of locking or catching.  He states his left knee is doing well.  Takes naproxen for pain.  He has retired.  He golfs.  He works for [employer] for [employer].              ROS: All systems reviewed are negative as they relate to the chief complaint within the history of present illness.  Patient denies  fevers or chills.   Assessment & Plan: Visit Diagnoses:  1. Right knee pain, unspecified chronicity   2. Unilateral primary osteoarthritis, right knee     Plan: Pression is right knee arthritis.  [place] did very well with injections for a very long time with his left knee.  Right knee has end-stage arthritis present.  Plan is aspiration and injection today.  I will see him back as needed.  Follow-Up Instructions: Return if symptoms worsen or fail to improve.   Orders:  Orders Placed This Encounter  Procedures  . XR Knee 1-2 Views Right   No orders of the defined types were placed in this encounter.     Procedures: Large Joint Inj: L knee on 12/13/2017 8:39 AM Indications: diagnostic evaluation, joint swelling and pain Details: 18 G 1.5 in needle, superolateral approach  Arthrogram: No  Medications: 5 mL lidocaine 1 %; 40 mg methylPREDNISolone acetate 40 MG/ML; 4 mL bupivacaine 0.25 % Aspirate: 20 mL yellow Outcome: tolerated well, no immediate complications Procedure, treatment alternatives, risks and benefits explained, specific risks  discussed. Consent was given by the patient. Immediately prior to procedure a time out was called to verify the correct patient, procedure, equipment, support staff and site/side marked as required. Patient was prepped and draped in the usual sterile fashion.       Clinical Data: No additional findings.  Objective: Vital Signs: There were no vitals taken for this visit.  Physical Exam:   Constitutional: Patient appears well-developed HEENT:  Head: Normocephalic Eyes:EOM are normal Neck: Normal range of motion Cardiovascular: Normal rate Pulmonary/chest: Effort normal Neurologic: Patient is alert Skin: Skin is warm Psychiatric: Patient has normal mood and affect    Ortho Exam: Ortho exam demonstrates full active and passive range of motion of the left knee.  The right knee has flexion to about 105.  Lacks about 5 degrees of full extension.  Valgus alignment is present.  Pedal pulses palpable.  Mild effusion is present.  He has lateral greater than medial joint line tenderness.  No groin pain with internal/external rotation of the leg.  Specialty Comments:  No specialty comments available.  Imaging: No results found.   PMFS History: Patient Active Problem List   Diagnosis Date Noted  . Degenerative arthritis of knee 07/05/2014   Past Medical History:  Diagnosis Date  . Arthritis   . GERD (gastroesophageal reflux disease)   . Hypertension     Family History  Problem Relation Age of Onset  . Heart attack Mother   . Cirrhosis Father   . CAD Father     Past Surgical History:  Procedure Laterality Date  . COLONOSCOPY    . KNEE ARTHROSCOPY Left   . TOE SURGERY Right   . TONSILLECTOMY    . TOTAL KNEE ARTHROPLASTY Left 07/05/2014   Procedure: TOTAL KNEE ARTHROPLASTY;  Surgeon: Meredith Pel, MD;  Location: [facility];  Service: Orthopedics;  Laterality: Left;  Marland Kitchen VASECTOMY     Social History   Occupational History  . Not on file  Tobacco Use  . Smoking status:  Former Smoker    Last attempt to quit: 03/27/1998    Years since quitting: 19.7  . Smokeless tobacco: Never Used  Substance and Sexual Activity  . Alcohol use: No  . Drug use: No  . Sexual activity: Not on file

## 2017-12-22 DIAGNOSIS — R0989 Other specified symptoms and signs involving the circulatory and respiratory systems: Secondary | ICD-10-CM | POA: Diagnosis not present

## 2017-12-22 DIAGNOSIS — R509 Fever, unspecified: Secondary | ICD-10-CM | POA: Diagnosis not present

## 2017-12-24 DIAGNOSIS — R509 Fever, unspecified: Secondary | ICD-10-CM | POA: Diagnosis not present

## 2017-12-24 DIAGNOSIS — R0989 Other specified symptoms and signs involving the circulatory and respiratory systems: Secondary | ICD-10-CM | POA: Diagnosis not present

## 2018-01-19 DIAGNOSIS — D1801 Hemangioma of skin and subcutaneous tissue: Secondary | ICD-10-CM | POA: Diagnosis not present

## 2018-01-19 DIAGNOSIS — L812 Freckles: Secondary | ICD-10-CM | POA: Diagnosis not present

## 2018-01-19 DIAGNOSIS — Z85828 Personal history of other malignant neoplasm of skin: Secondary | ICD-10-CM | POA: Diagnosis not present

## 2018-01-19 DIAGNOSIS — L821 Other seborrheic keratosis: Secondary | ICD-10-CM | POA: Diagnosis not present

## 2018-01-19 DIAGNOSIS — D225 Melanocytic nevi of trunk: Secondary | ICD-10-CM | POA: Diagnosis not present

## 2018-01-19 DIAGNOSIS — Z85820 Personal history of malignant melanoma of skin: Secondary | ICD-10-CM | POA: Diagnosis not present

## 2018-01-19 DIAGNOSIS — L570 Actinic keratosis: Secondary | ICD-10-CM | POA: Diagnosis not present

## 2018-05-04 DIAGNOSIS — H532 Diplopia: Secondary | ICD-10-CM | POA: Diagnosis not present

## 2018-05-04 DIAGNOSIS — H5211 Myopia, right eye: Secondary | ICD-10-CM | POA: Diagnosis not present

## 2018-05-04 DIAGNOSIS — H2511 Age-related nuclear cataract, right eye: Secondary | ICD-10-CM | POA: Diagnosis not present

## 2018-05-25 DIAGNOSIS — Z23 Encounter for immunization: Secondary | ICD-10-CM | POA: Diagnosis not present

## 2018-06-11 DIAGNOSIS — H25811 Combined forms of age-related cataract, right eye: Secondary | ICD-10-CM | POA: Diagnosis not present

## 2018-06-11 DIAGNOSIS — H2511 Age-related nuclear cataract, right eye: Secondary | ICD-10-CM | POA: Diagnosis not present

## 2018-07-20 DIAGNOSIS — Z125 Encounter for screening for malignant neoplasm of prostate: Secondary | ICD-10-CM | POA: Diagnosis not present

## 2018-07-20 DIAGNOSIS — Z0000 Encounter for general adult medical examination without abnormal findings: Secondary | ICD-10-CM | POA: Diagnosis not present

## 2018-07-20 DIAGNOSIS — R0989 Other specified symptoms and signs involving the circulatory and respiratory systems: Secondary | ICD-10-CM | POA: Diagnosis not present

## 2018-07-20 DIAGNOSIS — Z87891 Personal history of nicotine dependence: Secondary | ICD-10-CM | POA: Diagnosis not present

## 2018-07-20 DIAGNOSIS — Z1389 Encounter for screening for other disorder: Secondary | ICD-10-CM | POA: Diagnosis not present

## 2018-07-20 DIAGNOSIS — M170 Bilateral primary osteoarthritis of knee: Secondary | ICD-10-CM | POA: Diagnosis not present

## 2018-07-20 DIAGNOSIS — N183 Chronic kidney disease, stage 3 (moderate): Secondary | ICD-10-CM | POA: Diagnosis not present

## 2018-07-20 DIAGNOSIS — E782 Mixed hyperlipidemia: Secondary | ICD-10-CM | POA: Diagnosis not present

## 2018-07-20 DIAGNOSIS — I10 Essential (primary) hypertension: Secondary | ICD-10-CM | POA: Diagnosis not present

## 2018-07-21 ENCOUNTER — Other Ambulatory Visit: Payer: Self-pay | Admitting: Family Medicine

## 2018-07-21 DIAGNOSIS — Z87891 Personal history of nicotine dependence: Secondary | ICD-10-CM

## 2018-07-22 ENCOUNTER — Other Ambulatory Visit: Payer: Self-pay | Admitting: Family Medicine

## 2018-07-22 DIAGNOSIS — I10 Essential (primary) hypertension: Secondary | ICD-10-CM | POA: Diagnosis not present

## 2018-07-22 DIAGNOSIS — R0989 Other specified symptoms and signs involving the circulatory and respiratory systems: Secondary | ICD-10-CM | POA: Diagnosis not present

## 2018-07-22 DIAGNOSIS — M170 Bilateral primary osteoarthritis of knee: Secondary | ICD-10-CM | POA: Diagnosis not present

## 2018-07-22 DIAGNOSIS — Z125 Encounter for screening for malignant neoplasm of prostate: Secondary | ICD-10-CM | POA: Diagnosis not present

## 2018-07-22 DIAGNOSIS — E782 Mixed hyperlipidemia: Secondary | ICD-10-CM | POA: Diagnosis not present

## 2018-07-22 DIAGNOSIS — Z87891 Personal history of nicotine dependence: Secondary | ICD-10-CM | POA: Diagnosis not present

## 2018-07-22 DIAGNOSIS — N183 Chronic kidney disease, stage 3 (moderate): Secondary | ICD-10-CM | POA: Diagnosis not present

## 2018-07-23 DIAGNOSIS — L812 Freckles: Secondary | ICD-10-CM | POA: Diagnosis not present

## 2018-07-23 DIAGNOSIS — D1801 Hemangioma of skin and subcutaneous tissue: Secondary | ICD-10-CM | POA: Diagnosis not present

## 2018-07-23 DIAGNOSIS — L821 Other seborrheic keratosis: Secondary | ICD-10-CM | POA: Diagnosis not present

## 2018-07-23 DIAGNOSIS — L570 Actinic keratosis: Secondary | ICD-10-CM | POA: Diagnosis not present

## 2018-07-23 DIAGNOSIS — Z85820 Personal history of malignant melanoma of skin: Secondary | ICD-10-CM | POA: Diagnosis not present

## 2018-07-23 DIAGNOSIS — Z85828 Personal history of other malignant neoplasm of skin: Secondary | ICD-10-CM | POA: Diagnosis not present

## 2018-07-27 ENCOUNTER — Ambulatory Visit
Admission: RE | Admit: 2018-07-27 | Discharge: 2018-07-27 | Disposition: A | Discharge status: Home / Self Care | Payer: Medicare Other | Source: Ambulatory Visit | Attending: Family Medicine | Admitting: Family Medicine

## 2018-07-27 ENCOUNTER — Ambulatory Visit: Payer: Managed Care, Other (non HMO)

## 2018-07-27 DIAGNOSIS — Z136 Encounter for screening for cardiovascular disorders: Secondary | ICD-10-CM | POA: Diagnosis not present

## 2018-07-27 DIAGNOSIS — I6523 Occlusion and stenosis of bilateral carotid arteries: Secondary | ICD-10-CM | POA: Diagnosis not present

## 2018-07-27 DIAGNOSIS — Z87891 Personal history of nicotine dependence: Secondary | ICD-10-CM

## 2018-07-27 DIAGNOSIS — R0989 Other specified symptoms and signs involving the circulatory and respiratory systems: Secondary | ICD-10-CM

## 2018-08-05 ENCOUNTER — Encounter (INDEPENDENT_AMBULATORY_CARE_PROVIDER_SITE_OTHER): Payer: Self-pay | Admitting: Orthopedic Surgery

## 2018-08-05 ENCOUNTER — Ambulatory Visit (INDEPENDENT_AMBULATORY_CARE_PROVIDER_SITE_OTHER): Payer: Medicare Other | Admitting: Orthopedic Surgery

## 2018-08-05 DIAGNOSIS — M1711 Unilateral primary osteoarthritis, right knee: Secondary | ICD-10-CM | POA: Diagnosis not present

## 2018-08-05 DIAGNOSIS — M25561 Pain in right knee: Secondary | ICD-10-CM

## 2018-08-05 DIAGNOSIS — M25461 Effusion, right knee: Secondary | ICD-10-CM

## 2018-08-05 MED ORDER — BUPIVACAINE HCL 0.25 % IJ SOLN
4.0000 mL | INTRAMUSCULAR | Status: AC | PRN
  Administered 2018-08-05: 4 mL via INTRA_ARTICULAR

## 2018-08-05 MED ORDER — METHYLPREDNISOLONE ACETATE 40 MG/ML IJ SUSP
40.0000 mg | INTRAMUSCULAR | Status: AC | PRN
  Administered 2018-08-05: 40 mg via INTRA_ARTICULAR

## 2018-08-05 MED ORDER — LIDOCAINE HCL 1 % IJ SOLN
5.0000 mL | INTRAMUSCULAR | Status: AC | PRN
  Administered 2018-08-05: 5 mL

## 2018-08-05 NOTE — Progress Notes (Signed)
Office Visit Note   Patient: Billy Fields           Date of Birth: [birth year]           MRN: [id number] Visit Date: 08/05/2018 Requested by: [facility] [address] [place], [place] [address] PCP: [facility], [place] @ [facility]  Subjective: Chief Complaint  Patient presents with  . Right Knee - Pain, Edema    HPI: Billy Fields is a 68 year old patient with right knee pain.  Has left total knee replacement and done well with that.  Reports several month history of fluid on the right knee which is giving him pain.  Patient states the knee gets tight.  He is going to [place] in April.  He likes to play golf.  He does have known end-stage arthritis in that right knee.              ROS: All systems reviewed are negative as they relate to the chief complaint within the history of present illness.  Patient denies  fevers or chills.   Assessment & Plan: Visit Diagnoses:  1. Unilateral primary osteoarthritis, right knee   2. Pain and swelling of right knee     Plan: Impression is osteoarthritis right knee with end-stage arthritis present.  Plan is aspiration injection of that knee with non-loadbearing quad strengthening exercises.  May need to see him back just before his April trip to [place] to aspirate the fluid out of the knee again.  He may be heading for knee replacement sometime in the future.  I will see him back as needed  Follow-Up Instructions: Return if symptoms worsen or fail to improve.   Orders:  No orders of the defined types were placed in this encounter.  No orders of the defined types were placed in this encounter.     Procedures: Large Joint Inj: R knee on 08/05/2018 2:25 PM Indications: diagnostic evaluation, joint swelling and pain Details: 18 G 1.5 in needle, superolateral approach  Arthrogram: No  Medications: 5 mL lidocaine 1 %; 40 mg methylPREDNISolone acetate 40 MG/ML; 4 mL bupivacaine 0.25 % Aspirate: 70 mL  yellow Outcome: tolerated well, no immediate complications Procedure, treatment alternatives, risks and benefits explained, specific risks discussed. Consent was given by the patient. Immediately prior to procedure a time out was called to verify the correct patient, procedure, equipment, support staff and site/side marked as required. Patient was prepped and draped in the usual sterile fashion.       Clinical Data: No additional findings.  Objective: Vital Signs: There were no vitals taken for this visit.  Physical Exam:   Constitutional: Patient appears well-developed HEENT:  Head: Normocephalic Eyes:EOM are normal Neck: Normal range of motion Cardiovascular: Normal rate Pulmonary/chest: Effort normal Neurologic: Patient is alert Skin: Skin is warm Psychiatric: Patient has normal mood and affect    Ortho Exam: Ortho exam demonstrates full active and passive range of motion of that right knee.  Effusion is present.  Valgus alignment noted.  Pedal pulses palpable.  No groin pain with internal X rotation of leg.  No other masses lymphadenopathy or skin changes noted in the right knee region  Specialty Comments:  No specialty comments available.  Imaging: No results found.   PMFS History: Patient Active Problem List   Diagnosis Date Noted  . Degenerative arthritis of knee 07/05/2014   Past Medical History:  Diagnosis Date  . Arthritis   . GERD (gastroesophageal reflux disease)   .  Hypertension     Family History  Problem Relation Age of Onset  . Heart attack Mother   . Cirrhosis Father   . CAD Father     Past Surgical History:  Procedure Laterality Date  . COLONOSCOPY    . KNEE ARTHROSCOPY Left   . TOE SURGERY Right   . TONSILLECTOMY    . TOTAL KNEE ARTHROPLASTY Left 07/05/2014   Procedure: TOTAL KNEE ARTHROPLASTY;  Surgeon: Meredith Pel, MD;  Location: [facility];  Service: Orthopedics;  Laterality: Left;  Marland Kitchen VASECTOMY     Social History   Occupational  History  . Not on file  Tobacco Use  . Smoking status: Former Smoker    Last attempt to quit: 03/27/1998    Years since quitting: 20.3  . Smokeless tobacco: Never Used  Substance and Sexual Activity  . Alcohol use: No  . Drug use: No  . Sexual activity: Not on file

## 2018-09-04 DIAGNOSIS — I10 Essential (primary) hypertension: Secondary | ICD-10-CM | POA: Diagnosis not present

## 2018-09-04 DIAGNOSIS — E782 Mixed hyperlipidemia: Secondary | ICD-10-CM | POA: Diagnosis not present

## 2018-09-04 DIAGNOSIS — M170 Bilateral primary osteoarthritis of knee: Secondary | ICD-10-CM | POA: Diagnosis not present

## 2018-09-04 DIAGNOSIS — Z87891 Personal history of nicotine dependence: Secondary | ICD-10-CM | POA: Diagnosis not present

## 2018-09-04 DIAGNOSIS — N183 Chronic kidney disease, stage 3 (moderate): Secondary | ICD-10-CM | POA: Diagnosis not present

## 2018-09-04 DIAGNOSIS — R0989 Other specified symptoms and signs involving the circulatory and respiratory systems: Secondary | ICD-10-CM | POA: Diagnosis not present

## 2019-01-13 DIAGNOSIS — D1801 Hemangioma of skin and subcutaneous tissue: Secondary | ICD-10-CM | POA: Diagnosis not present

## 2019-01-13 DIAGNOSIS — D2272 Melanocytic nevi of left lower limb, including hip: Secondary | ICD-10-CM | POA: Diagnosis not present

## 2019-01-13 DIAGNOSIS — L812 Freckles: Secondary | ICD-10-CM | POA: Diagnosis not present

## 2019-01-13 DIAGNOSIS — D485 Neoplasm of uncertain behavior of skin: Secondary | ICD-10-CM | POA: Diagnosis not present

## 2019-01-13 DIAGNOSIS — L821 Other seborrheic keratosis: Secondary | ICD-10-CM | POA: Diagnosis not present

## 2019-01-13 DIAGNOSIS — Z85820 Personal history of malignant melanoma of skin: Secondary | ICD-10-CM | POA: Diagnosis not present

## 2019-01-13 DIAGNOSIS — Z85828 Personal history of other malignant neoplasm of skin: Secondary | ICD-10-CM | POA: Diagnosis not present

## 2019-01-13 DIAGNOSIS — D225 Melanocytic nevi of trunk: Secondary | ICD-10-CM | POA: Diagnosis not present

## 2019-01-13 DIAGNOSIS — L570 Actinic keratosis: Secondary | ICD-10-CM | POA: Diagnosis not present

## 2019-01-13 DIAGNOSIS — D499 Neoplasm of unspecified behavior of unspecified site: Secondary | ICD-10-CM | POA: Diagnosis not present

## 2019-01-28 DIAGNOSIS — L988 Other specified disorders of the skin and subcutaneous tissue: Secondary | ICD-10-CM | POA: Diagnosis not present

## 2019-01-28 DIAGNOSIS — Z85828 Personal history of other malignant neoplasm of skin: Secondary | ICD-10-CM | POA: Diagnosis not present

## 2019-01-28 DIAGNOSIS — D485 Neoplasm of uncertain behavior of skin: Secondary | ICD-10-CM | POA: Diagnosis not present

## 2019-03-03 ENCOUNTER — Ambulatory Visit (INDEPENDENT_AMBULATORY_CARE_PROVIDER_SITE_OTHER): Payer: Medicare Other | Admitting: Orthopedic Surgery

## 2019-03-03 ENCOUNTER — Other Ambulatory Visit: Payer: Self-pay

## 2019-03-03 DIAGNOSIS — M1711 Unilateral primary osteoarthritis, right knee: Secondary | ICD-10-CM

## 2019-03-03 DIAGNOSIS — Z23 Encounter for immunization: Secondary | ICD-10-CM | POA: Diagnosis not present

## 2019-03-05 ENCOUNTER — Encounter: Payer: Self-pay | Admitting: Orthopedic Surgery

## 2019-03-05 DIAGNOSIS — M1711 Unilateral primary osteoarthritis, right knee: Secondary | ICD-10-CM

## 2019-03-05 NOTE — Progress Notes (Signed)
   Procedure Note  Patient: Billy Fields             Date of Birth: 1951-03-17           MRN: [id number]             Visit Date: 03/03/2019  Procedures: Visit Diagnoses: No diagnosis found.  Large Joint Inj: R knee on 03/05/2019 4:45 PM Indications: diagnostic evaluation, joint swelling and pain Details: 18 G 1.5 in needle, superolateral approach  Arthrogram: No  Medications: 5 mL lidocaine 1 %; 40 mg methylPREDNISolone acetate 40 MG/ML; 4 mL bupivacaine 0.25 % Aspirate: serous Outcome: tolerated well, no immediate complications  6 months relief from the last injection Follow-up as needed Procedure, treatment alternatives, risks and benefits explained, specific risks discussed. Consent was given by the patient. Immediately prior to procedure a time out was called to verify the correct patient, procedure, equipment, support staff and site/side marked as required. Patient was prepped and draped in the usual sterile fashion.

## 2019-03-08 DIAGNOSIS — M170 Bilateral primary osteoarthritis of knee: Secondary | ICD-10-CM | POA: Diagnosis not present

## 2019-03-08 DIAGNOSIS — E782 Mixed hyperlipidemia: Secondary | ICD-10-CM | POA: Diagnosis not present

## 2019-03-08 DIAGNOSIS — Z87891 Personal history of nicotine dependence: Secondary | ICD-10-CM | POA: Diagnosis not present

## 2019-03-08 DIAGNOSIS — N183 Chronic kidney disease, stage 3 (moderate): Secondary | ICD-10-CM | POA: Diagnosis not present

## 2019-03-08 DIAGNOSIS — I10 Essential (primary) hypertension: Secondary | ICD-10-CM | POA: Diagnosis not present

## 2019-03-08 MED ORDER — BUPIVACAINE HCL 0.25 % IJ SOLN
4.0000 mL | INTRAMUSCULAR | Status: AC | PRN
  Administered 2019-03-05: 4 mL via INTRA_ARTICULAR

## 2019-03-08 MED ORDER — LIDOCAINE HCL 1 % IJ SOLN
5.0000 mL | INTRAMUSCULAR | Status: AC | PRN
  Administered 2019-03-05: 5 mL

## 2019-03-08 MED ORDER — METHYLPREDNISOLONE ACETATE 40 MG/ML IJ SUSP
40.0000 mg | INTRAMUSCULAR | Status: AC | PRN
  Administered 2019-03-05: 40 mg via INTRA_ARTICULAR

## 2019-03-09 DIAGNOSIS — N183 Chronic kidney disease, stage 3 (moderate): Secondary | ICD-10-CM | POA: Diagnosis not present

## 2019-03-09 DIAGNOSIS — I10 Essential (primary) hypertension: Secondary | ICD-10-CM | POA: Diagnosis not present

## 2019-03-09 DIAGNOSIS — E782 Mixed hyperlipidemia: Secondary | ICD-10-CM | POA: Diagnosis not present

## 2019-03-09 DIAGNOSIS — Z87891 Personal history of nicotine dependence: Secondary | ICD-10-CM | POA: Diagnosis not present

## 2019-03-09 DIAGNOSIS — M170 Bilateral primary osteoarthritis of knee: Secondary | ICD-10-CM | POA: Diagnosis not present

## 2019-04-26 ENCOUNTER — Other Ambulatory Visit: Payer: Self-pay

## 2019-04-26 DIAGNOSIS — Z20828 Contact with and (suspected) exposure to other viral communicable diseases: Secondary | ICD-10-CM | POA: Diagnosis not present

## 2019-04-26 DIAGNOSIS — Z20822 Contact with and (suspected) exposure to covid-19: Secondary | ICD-10-CM

## 2019-04-27 LAB — NOVEL CORONAVIRUS, NAA: SARS-CoV-2, NAA: NOT DETECTED

## 2019-05-17 ENCOUNTER — Other Ambulatory Visit: Payer: Self-pay

## 2019-05-17 DIAGNOSIS — Z20822 Contact with and (suspected) exposure to covid-19: Secondary | ICD-10-CM

## 2019-05-17 DIAGNOSIS — Z20828 Contact with and (suspected) exposure to other viral communicable diseases: Secondary | ICD-10-CM | POA: Diagnosis not present

## 2019-05-18 LAB — NOVEL CORONAVIRUS, NAA: SARS-CoV-2, NAA: NOT DETECTED

## 2019-07-02 DIAGNOSIS — Z961 Presence of intraocular lens: Secondary | ICD-10-CM | POA: Diagnosis not present

## 2019-07-02 DIAGNOSIS — H532 Diplopia: Secondary | ICD-10-CM | POA: Diagnosis not present

## 2019-07-02 DIAGNOSIS — H52203 Unspecified astigmatism, bilateral: Secondary | ICD-10-CM | POA: Diagnosis not present

## 2019-07-02 DIAGNOSIS — H35373 Puckering of macula, bilateral: Secondary | ICD-10-CM | POA: Diagnosis not present

## 2019-07-20 DIAGNOSIS — Z85828 Personal history of other malignant neoplasm of skin: Secondary | ICD-10-CM | POA: Diagnosis not present

## 2019-07-20 DIAGNOSIS — L570 Actinic keratosis: Secondary | ICD-10-CM | POA: Diagnosis not present

## 2019-07-20 DIAGNOSIS — L821 Other seborrheic keratosis: Secondary | ICD-10-CM | POA: Diagnosis not present

## 2019-07-20 DIAGNOSIS — D2262 Melanocytic nevi of left upper limb, including shoulder: Secondary | ICD-10-CM | POA: Diagnosis not present

## 2019-07-20 DIAGNOSIS — D485 Neoplasm of uncertain behavior of skin: Secondary | ICD-10-CM | POA: Diagnosis not present

## 2019-07-20 DIAGNOSIS — D225 Melanocytic nevi of trunk: Secondary | ICD-10-CM | POA: Diagnosis not present

## 2019-07-20 DIAGNOSIS — C44519 Basal cell carcinoma of skin of other part of trunk: Secondary | ICD-10-CM | POA: Diagnosis not present

## 2019-07-20 DIAGNOSIS — C44712 Basal cell carcinoma of skin of right lower limb, including hip: Secondary | ICD-10-CM | POA: Diagnosis not present

## 2019-07-23 DIAGNOSIS — I129 Hypertensive chronic kidney disease with stage 1 through stage 4 chronic kidney disease, or unspecified chronic kidney disease: Secondary | ICD-10-CM | POA: Diagnosis not present

## 2019-07-23 DIAGNOSIS — R0989 Other specified symptoms and signs involving the circulatory and respiratory systems: Secondary | ICD-10-CM | POA: Diagnosis not present

## 2019-07-23 DIAGNOSIS — M170 Bilateral primary osteoarthritis of knee: Secondary | ICD-10-CM | POA: Diagnosis not present

## 2019-07-23 DIAGNOSIS — Z0000 Encounter for general adult medical examination without abnormal findings: Secondary | ICD-10-CM | POA: Diagnosis not present

## 2019-07-23 DIAGNOSIS — Z125 Encounter for screening for malignant neoplasm of prostate: Secondary | ICD-10-CM | POA: Diagnosis not present

## 2019-07-23 DIAGNOSIS — E782 Mixed hyperlipidemia: Secondary | ICD-10-CM | POA: Diagnosis not present

## 2019-07-23 DIAGNOSIS — N1831 Chronic kidney disease, stage 3a: Secondary | ICD-10-CM | POA: Diagnosis not present

## 2019-07-23 DIAGNOSIS — Z1211 Encounter for screening for malignant neoplasm of colon: Secondary | ICD-10-CM | POA: Diagnosis not present

## 2019-07-23 DIAGNOSIS — I10 Essential (primary) hypertension: Secondary | ICD-10-CM | POA: Diagnosis not present

## 2019-07-29 DIAGNOSIS — Z23 Encounter for immunization: Secondary | ICD-10-CM | POA: Diagnosis not present

## 2019-08-04 DIAGNOSIS — I6523 Occlusion and stenosis of bilateral carotid arteries: Secondary | ICD-10-CM | POA: Diagnosis not present

## 2019-08-04 DIAGNOSIS — R0989 Other specified symptoms and signs involving the circulatory and respiratory systems: Secondary | ICD-10-CM | POA: Diagnosis not present

## 2019-09-01 ENCOUNTER — Encounter: Payer: Self-pay | Admitting: Orthopedic Surgery

## 2019-09-01 ENCOUNTER — Other Ambulatory Visit: Payer: Self-pay

## 2019-09-01 ENCOUNTER — Ambulatory Visit (INDEPENDENT_AMBULATORY_CARE_PROVIDER_SITE_OTHER): Payer: Medicare Other | Admitting: Orthopedic Surgery

## 2019-09-01 DIAGNOSIS — M1711 Unilateral primary osteoarthritis, right knee: Secondary | ICD-10-CM

## 2019-09-02 ENCOUNTER — Encounter: Payer: Self-pay | Admitting: Orthopedic Surgery

## 2019-09-02 DIAGNOSIS — M1711 Unilateral primary osteoarthritis, right knee: Secondary | ICD-10-CM

## 2019-09-02 MED ORDER — LIDOCAINE HCL 1 % IJ SOLN
5.0000 mL | INTRAMUSCULAR | Status: AC | PRN
  Administered 2019-09-02: 5 mL

## 2019-09-02 MED ORDER — BUPIVACAINE HCL 0.25 % IJ SOLN
4.0000 mL | INTRAMUSCULAR | Status: AC | PRN
  Administered 2019-09-02: 4 mL via INTRA_ARTICULAR

## 2019-09-02 MED ORDER — METHYLPREDNISOLONE ACETATE 40 MG/ML IJ SUSP
40.0000 mg | INTRAMUSCULAR | Status: AC | PRN
  Administered 2019-09-02: 40 mg via INTRA_ARTICULAR

## 2019-09-02 NOTE — Progress Notes (Signed)
Office Visit Note   Patient: Billy Fields           Date of Birth: June 21, 1951           MRN: [id number] Visit Date: 09/01/2019 Requested by: [facility] [address] [place],  [place] [address] PCP: [employer], [place] @ [facility]  Subjective: Chief Complaint  Patient presents with  . Right Knee - Pain    HPI: Billy Fields is a 69 year old patient is doing well with his left knee replacement.  He reports right knee pain.  Requesting aspiration and injection today.  His daughter is getting married in 10 days.  He is having some mild pain to moderate pain in the right leg.  Denies any mechanical symptoms or any interval injury.  Is taking over-the-counter medicine for the problem.              ROS: All systems reviewed are negative as they relate to the chief complaint within the history of present illness.  Patient denies  fevers or chills.   Assessment & Plan: Visit Diagnoses:  1. Unilateral primary osteoarthritis, right knee     Plan: Impression is end-stage arthritis right knee.  Aspiration injection performed today.  He is considering knee replacement in the near future.  We would need to wait at least 3 months from the injection today.  He is happy with his left knee replacement function.  Follow-up with me as needed.  Follow-Up Instructions: No follow-ups on file.   Orders:  No orders of the defined types were placed in this encounter.  No orders of the defined types were placed in this encounter.     Procedures: Large Joint Inj: R knee on 09/02/2019 8:57 PM Indications: diagnostic evaluation, joint swelling and pain Details: 18 G 1.5 in needle, superolateral approach  Arthrogram: No  Medications: 5 mL lidocaine 1 %; 40 mg methylPREDNISolone acetate 40 MG/ML; 4 mL bupivacaine 0.25 % Outcome: tolerated well, no immediate complications Procedure, treatment alternatives, risks and benefits explained, specific risks discussed.  Consent was given by the patient. Immediately prior to procedure a time out was called to verify the correct patient, procedure, equipment, support staff and site/side marked as required. Patient was prepped and draped in the usual sterile fashion.    60 cc aspirated.  Serous.  Not sent to lab.   Clinical Data: No additional findings.  Objective: Vital Signs: There were no vitals taken for this visit.  Physical Exam:   Constitutional: Patient appears well-developed HEENT:  Head: Normocephalic Eyes:EOM are normal Neck: Normal range of motion Cardiovascular: Normal rate Pulmonary/chest: Effort normal Neurologic: Patient is alert Skin: Skin is warm Psychiatric: Patient has normal mood and affect    Ortho Exam: Ortho exam demonstrates pretty reasonable range of motion of the right knee.  Not much of a flexion contracture he does have flexion to about 110.  Collateral and cruciate ligaments are stable.  Extensor mechanism is intact.  No masses lymphadenopathy or skin changes noted in that right knee region.  Pedal pulses palpable.  Specialty Comments:  No specialty comments available.  Imaging: No results found.   PMFS History: Patient Active Problem List   Diagnosis Date Noted  . Degenerative arthritis of knee 07/05/2014   Past Medical History:  Diagnosis Date  . Arthritis   . GERD (gastroesophageal reflux disease)   . Hypertension     Family History  Problem Relation Age of Onset  . Heart attack Mother   .  Cirrhosis Father   . CAD Father     Past Surgical History:  Procedure Laterality Date  . COLONOSCOPY    . KNEE ARTHROSCOPY Left   . TOE SURGERY Right   . TONSILLECTOMY    . TOTAL KNEE ARTHROPLASTY Left 07/05/2014   Procedure: TOTAL KNEE ARTHROPLASTY;  Surgeon: Meredith Pel, MD;  Location: [facility];  Service: Orthopedics;  Laterality: Left;  Marland Kitchen VASECTOMY     Social History   Occupational History  . Not on file  Tobacco Use  . Smoking status: Former  Smoker    Quit date: 03/27/1998    Years since quitting: 21.4  . Smokeless tobacco: Never Used  Substance and Sexual Activity  . Alcohol use: No  . Drug use: No  . Sexual activity: Not on file

## 2019-09-06 DIAGNOSIS — N1831 Chronic kidney disease, stage 3a: Secondary | ICD-10-CM | POA: Diagnosis not present

## 2019-09-06 DIAGNOSIS — I10 Essential (primary) hypertension: Secondary | ICD-10-CM | POA: Diagnosis not present

## 2019-09-06 DIAGNOSIS — I129 Hypertensive chronic kidney disease with stage 1 through stage 4 chronic kidney disease, or unspecified chronic kidney disease: Secondary | ICD-10-CM | POA: Diagnosis not present

## 2019-09-06 DIAGNOSIS — E782 Mixed hyperlipidemia: Secondary | ICD-10-CM | POA: Diagnosis not present

## 2019-09-06 DIAGNOSIS — Z125 Encounter for screening for malignant neoplasm of prostate: Secondary | ICD-10-CM | POA: Diagnosis not present

## 2019-09-07 DIAGNOSIS — E782 Mixed hyperlipidemia: Secondary | ICD-10-CM | POA: Diagnosis not present

## 2019-09-07 DIAGNOSIS — N1831 Chronic kidney disease, stage 3a: Secondary | ICD-10-CM | POA: Diagnosis not present

## 2019-09-07 DIAGNOSIS — I10 Essential (primary) hypertension: Secondary | ICD-10-CM | POA: Diagnosis not present

## 2019-09-07 DIAGNOSIS — Z125 Encounter for screening for malignant neoplasm of prostate: Secondary | ICD-10-CM | POA: Diagnosis not present

## 2019-09-07 DIAGNOSIS — I129 Hypertensive chronic kidney disease with stage 1 through stage 4 chronic kidney disease, or unspecified chronic kidney disease: Secondary | ICD-10-CM | POA: Diagnosis not present

## 2020-01-10 DIAGNOSIS — D1801 Hemangioma of skin and subcutaneous tissue: Secondary | ICD-10-CM | POA: Diagnosis not present

## 2020-01-10 DIAGNOSIS — Z85828 Personal history of other malignant neoplasm of skin: Secondary | ICD-10-CM | POA: Diagnosis not present

## 2020-01-10 DIAGNOSIS — L570 Actinic keratosis: Secondary | ICD-10-CM | POA: Diagnosis not present

## 2020-01-10 DIAGNOSIS — D225 Melanocytic nevi of trunk: Secondary | ICD-10-CM | POA: Diagnosis not present

## 2020-01-10 DIAGNOSIS — Z85820 Personal history of malignant melanoma of skin: Secondary | ICD-10-CM | POA: Diagnosis not present

## 2020-01-10 DIAGNOSIS — L821 Other seborrheic keratosis: Secondary | ICD-10-CM | POA: Diagnosis not present

## 2020-01-10 DIAGNOSIS — L812 Freckles: Secondary | ICD-10-CM | POA: Diagnosis not present

## 2020-01-20 ENCOUNTER — Ambulatory Visit (INDEPENDENT_AMBULATORY_CARE_PROVIDER_SITE_OTHER): Payer: Medicare Other

## 2020-01-20 ENCOUNTER — Ambulatory Visit (INDEPENDENT_AMBULATORY_CARE_PROVIDER_SITE_OTHER): Payer: Medicare Other | Admitting: Orthopedic Surgery

## 2020-01-20 ENCOUNTER — Encounter: Payer: Self-pay | Admitting: Orthopedic Surgery

## 2020-01-20 VITALS — Ht 72.0 in | Wt 223.0 lb

## 2020-01-20 DIAGNOSIS — M1711 Unilateral primary osteoarthritis, right knee: Secondary | ICD-10-CM

## 2020-01-20 NOTE — Progress Notes (Signed)
Office Visit Note   Patient: Billy Fields           Date of Birth: 1951/02/05           MRN: [id number] Visit Date: 01/20/2020 Requested by: [facility] [address] [place],  [place] [address] PCP: [employer], [place] @ [facility]  Subjective: Chief Complaint  Patient presents with  . Right Knee - Pain    HPI: Billy Fields is a 69 year old patient with right knee pain.  Has known history of right knee arthritis.  Had injection 09/01/2019 which only caused him to have temporary relief.  Fluid recurs thereafter.  He has done well with his left total knee replacement done about 5 years ago.  Describes increased pain with ambulation.  He likes to play golf.  Starting to hurt more when he has played golf.  He is on high blood pressure medicine.  After his last total knee replacement the patient had preoperative cardiac risk ratification beforehand and had normal studies.  He has not had any interval heart or chest pain issues.  He is not seen by cardiologist.              ROS: All systems reviewed are negative as they relate to the chief complaint within the history of present illness.  Patient denies  fevers or chills.   Assessment & Plan: Visit Diagnoses:  1. Unilateral primary osteoarthritis, right knee     Plan: Impression is right knee pain.  Plan is right total knee replacement.  Would like to match sizes based on his left knee.  Risk and benefits are discussed include not limited to infection nerve vessel damage incomplete pain relief knee stiffness.  Patient understands the risk benefits and wishes to proceed.  All questions answered.  Follow-Up Instructions: No follow-ups on file.   Orders:  Orders Placed This Encounter  Procedures  . XR KNEE 3 VIEW RIGHT   No orders of the defined types were placed in this encounter.     Procedures: No procedures performed   Clinical Data: No additional findings.  Objective: Vital Signs: Ht  6' (1.829 m)   Wt (!) 223 lb (101.2 kg)   BMI 30.24 kg/m   Physical Exam:   Constitutional: Patient appears well-developed HEENT:  Head: Normocephalic Eyes:EOM are normal Neck: Normal range of motion Cardiovascular: Normal rate Pulmonary/chest: Effort normal Neurologic: Patient is alert Skin: Skin is warm Psychiatric: Patient has normal mood and affect    Ortho Exam: Ortho exam demonstrates moderate right knee effusion.  Lacks about 5 degrees of full extension and can flex to about 105.  Slight valgus alignment.  Pedal pulses palpable.  No masses lymphadenopathy or skin changes noted in that right knee region.  Collaterals are stable.  Extensor mechanism intact  Specialty Comments:  No specialty comments available.  Imaging: XR KNEE 3 VIEW RIGHT  Result Date: 01/20/2020 AP lateral merchant right knee reviewed.  End-stage tricompartmental arthritis is present.  Alignment intact slight valgus.  No acute fracture.  Left total knee replacement in good position with no complicating features.  Bone quality appears good.    PMFS History: Patient Active Problem List   Diagnosis Date Noted  . Degenerative arthritis of knee 07/05/2014   Past Medical History:  Diagnosis Date  . Arthritis   . GERD (gastroesophageal reflux disease)   . Hypertension     Family History  Problem Relation Age of Onset  . Heart attack Mother   .  Cirrhosis Father   . CAD Father     Past Surgical History:  Procedure Laterality Date  . COLONOSCOPY    . KNEE ARTHROSCOPY Left   . TOE SURGERY Right   . TONSILLECTOMY    . TOTAL KNEE ARTHROPLASTY Left 07/05/2014   Procedure: TOTAL KNEE ARTHROPLASTY;  Surgeon: Meredith Pel, MD;  Location: [facility];  Service: Orthopedics;  Laterality: Left;  Marland Kitchen VASECTOMY     Social History   Occupational History  . Not on file  Tobacco Use  . Smoking status: Former Smoker    Quit date: 03/27/1998    Years since quitting: 21.8  . Smokeless tobacco: Never Used    Substance and Sexual Activity  . Alcohol use: No  . Drug use: No  . Sexual activity: Not on file

## 2020-01-31 ENCOUNTER — Other Ambulatory Visit: Payer: Self-pay

## 2020-02-01 NOTE — Pre-Procedure Instructions (Addendum)
[facility] [address] [place] [address] Phone: [phone] Fax: [phone]  [facility] [place], [place] [address] [place] [address] Phone: [phone] Fax: [phone]  [id number] [address], [place] [address] Phone: [phone] Fax: [phone]    Your procedure is scheduled on Mon., Aug. 16, 2021 from 7:30AM-10:31AM  Report to [facility] Entrance "A" at 5:30AM  Call this number if you have problems the morning of surgery:  [phone]   Remember:  Do not eat after midnight on Aug. 15th You may have clear liquids until 3 hours (4:30AM) prior to surgery time. Clear liquids that are allowed are: Water, Juice (non-citric, without pulp), Carbonated beverages, Clear Tea, Black coffee only (no creamer, milk, or half and half), Plain Jell-O only, Gatorade, and Plain Popsicles only.  Enhanced Recovery after Surgery for Orthopedics Enhanced Recovery after Surgery is a protocol used to improve the stress on your body and your recovery after surgery. .  . The day of surgery (if you do NOT have diabetes):  o Drink ONE (1) Pre-Surgery Clear Ensure by ___4:30AM__ am the morning of surgery   o This drink was given to you during your hospital  pre-op appointment visit. o Nothing else to drink after completing the  Pre-Surgery Clear Ensure.         If you have questions, please contact your surgeon's office.     Take these medicines the morning of surgery with A SIP OF WATER:  NONE  Follow your surgeon's instructions on when to stop Aspirin.  If no instructions were given by your surgeon then you will need to call the office to get those instructions.    As of today, STOP taking all Aspirin (unless instructed by your doctor) and Other Aspirin containing products, Vitamins, Fish  oils, and Herbal medications. Also stop all NSAIDS i.e. Advil, Ibuprofen, Motrin, Aleve, Anaprox, Naproxen, BC, Goody Powders, and all Supplements.  No Smoking of any kind, Tobacco/ Vaping, or Alcohol products 24 hours prior to your procedure. If you use a Cpap at night, you may bring all equipment for your overnight stay.   Special instructions:                                                             - Preparing For Surgery  Before surgery, you can play an important role. Because skin is not sterile, your skin needs to be as free of germs as possible. You can reduce the number of germs on your skin by washing with CHG (chlorahexidine gluconate) Soap before surgery.  CHG is an antiseptic cleaner which kills germs and bonds with the skin to continue killing germs even after washing.    Please do not use if you have an allergy to CHG or antibacterial soaps. If your skin becomes reddened/irritated stop using the CHG.  Do not shave (including legs and underarms) for at least 48 hours prior to first CHG shower. It is OK to shave your face.  Please follow these instructions carefully.   1. Shower the NIGHT BEFORE SURGERY and the MORNING OF SURGERY with CHG.   2.  If you chose to wash your hair, wash your hair first as usual with your normal shampoo.  3. After you shampoo, rinse your hair and body thoroughly to remove the shampoo.  4. Use CHG as you would any other liquid soap. You can apply CHG directly to the skin and wash gently with a scrungie or a clean washcloth.   5. Apply the CHG Soap to your body ONLY FROM THE NECK DOWN.  Do not use on open wounds or open sores. Avoid contact with your eyes, ears, mouth and genitals (private parts). Wash Face and genitals (private parts)  with your normal soap.  6. Wash thoroughly, paying special attention to the area where your surgery will be performed.  7. Thoroughly rinse your body with warm water from the neck down.  8. DO NOT  shower/wash with your normal soap after using and rinsing off the CHG Soap.  9. Pat yourself dry with a CLEAN TOWEL.  10. Wear CLEAN PAJAMAS to bed the night before surgery, wear comfortable clothes the morning of surgery  11. Place CLEAN SHEETS on your bed the night of your first shower and DO NOT SLEEP WITH PETS.   Day of Surgery:           Remember to brush your teeth WITH YOUR REGULAR TOOTHPASTE.  Do not wear jewelry.  Do not wear lotions, powders, colognes, or deodorant.  Do not shave 48 hours prior to surgery.  Men may shave face and neck.  Do not bring valuables to the hospital.  [facility] is not responsible for any belongings or valuables.  Contacts, dentures or bridgework may not be worn into surgery.    For patients admitted to the hospital, discharge time will be determined by your treatment team.  Patients discharged the day of surgery will not be allowed to drive home, and someone age 70 and over needs to stay with them for 24 hours.  Please wear clean clothes to the hospital/surgery center.    Please read over the following fact sheets that you were given.

## 2020-02-02 ENCOUNTER — Other Ambulatory Visit: Payer: Self-pay

## 2020-02-02 ENCOUNTER — Encounter (HOSPITAL_COMMUNITY)
Admission: RE | Admit: 2020-02-02 | Discharge: 2020-02-02 | Disposition: A | Discharge status: Home / Self Care | Payer: Medicare Other | Source: Ambulatory Visit | Attending: Orthopedic Surgery | Admitting: Orthopedic Surgery

## 2020-02-02 ENCOUNTER — Encounter (HOSPITAL_COMMUNITY): Payer: Self-pay

## 2020-02-02 DIAGNOSIS — I10 Essential (primary) hypertension: Secondary | ICD-10-CM | POA: Diagnosis not present

## 2020-02-02 DIAGNOSIS — K219 Gastro-esophageal reflux disease without esophagitis: Secondary | ICD-10-CM | POA: Diagnosis not present

## 2020-02-02 DIAGNOSIS — Z79899 Other long term (current) drug therapy: Secondary | ICD-10-CM | POA: Diagnosis not present

## 2020-02-02 DIAGNOSIS — Z96652 Presence of left artificial knee joint: Secondary | ICD-10-CM | POA: Diagnosis not present

## 2020-02-02 DIAGNOSIS — Z01818 Encounter for other preprocedural examination: Secondary | ICD-10-CM | POA: Insufficient documentation

## 2020-02-02 DIAGNOSIS — Z87891 Personal history of nicotine dependence: Secondary | ICD-10-CM | POA: Diagnosis not present

## 2020-02-02 LAB — BASIC METABOLIC PANEL
Anion gap: 9 (ref 5–15)
BUN: 18 mg/dL (ref 8–23)
CO2: 26 mmol/L (ref 22–32)
Calcium: 10 mg/dL (ref 8.9–10.3)
Chloride: 106 mmol/L (ref 98–111)
Creatinine, Ser: 1.31 mg/dL — ABNORMAL HIGH (ref 0.61–1.24)
GFR calc Af Amer: >60 mL/min (ref >60)
GFR calc non Af Amer: 55 mL/min — ABNORMAL LOW (ref >60)
Glucose, Bld: 100 mg/dL — ABNORMAL HIGH (ref 70–99)
Potassium: 4.5 mmol/L (ref 3.5–5.1)
Sodium: 141 mmol/L (ref 135–145)

## 2020-02-02 LAB — URINALYSIS, ROUTINE W REFLEX MICROSCOPIC
Bilirubin Urine: NEGATIVE
Glucose, UA: NEGATIVE mg/dL
Hgb urine dipstick: NEGATIVE
Ketones, ur: NEGATIVE mg/dL
Leukocytes,Ua: NEGATIVE
Nitrite: NEGATIVE
Protein, ur: NEGATIVE mg/dL
Specific Gravity, Urine: 1.014 (ref 1.005–1.030)
pH: 7 (ref 5.0–8.0)

## 2020-02-02 LAB — CBC
HCT: 48.3 % (ref 39.0–52.0)
Hemoglobin: 15.6 g/dL (ref 13.0–17.0)
MCH: 29.3 pg (ref 26.0–34.0)
MCHC: 32.3 g/dL (ref 30.0–36.0)
MCV: 90.8 fL (ref 80.0–100.0)
Platelets: 190 10*3/uL (ref 150–400)
RBC: 5.32 MIL/uL (ref 4.22–5.81)
RDW: 11.9 % (ref 11.5–15.5)
WBC: 6.2 10*3/uL (ref 4.0–10.5)
nRBC: 0 % (ref 0.0–0.2)

## 2020-02-02 LAB — SURGICAL PCR SCREEN
MRSA, PCR: NEGATIVE
Staphylococcus aureus: NEGATIVE

## 2020-02-02 NOTE — Progress Notes (Addendum)
PCP - Dr. Jacelyn Grip with [facility] - Saw once for abn EKG 5 yrs ago cannot remember name  Chest x-ray - Not indicated EKG - 02/02/20 Stress Test - Denies ECHO - Denies Cardiac Cath - Denies  DM - Denies  Aspirin Instructions: Last dose 01/29/20  ERAS Protcol - Yes PRE-SURGERY Ensure given  COVID TEST- 02/03/20  Anesthesia review: yes abn ekg  Patient denies shortness of breath, fever, cough and chest pain at PAT appointment   All instructions explained to the patient, with a verbal understanding of the material. Patient agrees to go over the instructions while at home for a better understanding. Patient also instructed to self quarantine after being tested for COVID-19. The opportunity to ask questions was provided.

## 2020-02-03 ENCOUNTER — Other Ambulatory Visit (HOSPITAL_COMMUNITY)
Admission: RE | Admit: 2020-02-03 | Discharge: 2020-02-03 | Disposition: A | Discharge status: Home / Self Care | Payer: Medicare Other | Source: Ambulatory Visit | Attending: Orthopedic Surgery | Admitting: Orthopedic Surgery

## 2020-02-03 ENCOUNTER — Encounter (HOSPITAL_COMMUNITY): Payer: Self-pay | Admitting: Orthopedic Surgery

## 2020-02-03 ENCOUNTER — Other Ambulatory Visit: Payer: Self-pay

## 2020-02-03 DIAGNOSIS — Z20822 Contact with and (suspected) exposure to covid-19: Secondary | ICD-10-CM | POA: Insufficient documentation

## 2020-02-03 DIAGNOSIS — Z01812 Encounter for preprocedural laboratory examination: Secondary | ICD-10-CM | POA: Insufficient documentation

## 2020-02-03 LAB — SARS CORONAVIRUS 2 (TAT 6-24 HRS): SARS Coronavirus 2: NEGATIVE

## 2020-02-03 LAB — URINE CULTURE: Culture: NO GROWTH

## 2020-02-03 NOTE — Progress Notes (Signed)
COVID Vaccine Completed: Yes Date COVID Vaccine completed: 08/2019 COVID vaccine manufacturer:  Moderna      PCP - Dr. Jacelyn Grip last office visit 09/07/19 Cardiologist - N/A  Chest x-ray - greater than 1 year EKG - 02/02/20 in epic Stress Test - greater than 2 years ECHO - N/A Cardiac Cath - N/A  Sleep Study - N/A CPAP - N/A  Fasting Blood Sugar - N/A Checks Blood Sugar __N/A___ times a day  Blood Thinner Instructions:N/A Aspirin Instructions: Yes Last Dose: 01/29/20  Anesthesia review:  N/A  Patient denies shortness of breath, fever, cough and chest pain at PAT appointment   Patient verbalized understanding of instructions that were given to them at the PAT appointment. Patient was also instructed that they will need to review over the PAT instructions again at home before surgery.

## 2020-02-03 NOTE — Anesthesia Preprocedure Evaluation (Addendum)
Anesthesia Evaluation  Patient identified by MRN, date of birth, ID band Patient awake    Reviewed: Allergy & Precautions, NPO status , Patient's Chart, lab work & pertinent test results  Airway Mallampati: I       Dental  (+) Teeth Intact, Dental Advisory Given, Caps   Pulmonary former smoker,  Quit smoking 1999   Pulmonary exam normal breath sounds clear to auscultation       Cardiovascular hypertension, Pt. on medications Normal cardiovascular exam Rhythm:Regular Rate:Normal     Neuro/Psych negative neurological ROS  negative psych ROS   GI/Hepatic Neg liver ROS, GERD  Medicated and Controlled,  Endo/Other  negative endocrine ROS  Renal/GU Renal InsufficiencyRenal diseaseCr 1.31   BPH    Musculoskeletal  (+) Arthritis , Osteoarthritis,  Right knee OA   Abdominal Normal abdominal exam  (+)   Peds  Hematology negative hematology ROS (+) hct 48.3, plt 190   Anesthesia Other Findings   Reproductive/Obstetrics negative OB ROS                           Anesthesia Physical Anesthesia Plan  ASA: II  Anesthesia Plan: Spinal, Regional and MAC   Post-op Pain Management:  Regional for Post-op pain   Induction:   PONV Risk Score and Plan: 2 and Propofol infusion and TIVA  Airway Management Planned: Natural Airway and Nasal Cannula  Additional Equipment: None  Intra-op Plan:   Post-operative Plan:   Informed Consent: I have reviewed the patients History and Physical, chart, labs and discussed the procedure including the risks, benefits and alternatives for the proposed anesthesia with the patient or authorized representative who has indicated his/her understanding and acceptance.       Plan Discussed with: CRNA  Anesthesia Plan Comments: ( )      Anesthesia Quick Evaluation

## 2020-02-03 NOTE — Progress Notes (Signed)
Anesthesia Chart Review:  Case: [id number] Date/Time: 02/07/20 0715   Procedure: RIGHT TOTAL KNEE ARTHROPLASTY (Right Knee)   Anesthesia type: Spinal   Pre-op diagnosis: right knee osteoarthritis   Location: [facility] OR ROOM 05 / [place] [facility]   Surgeons: Meredith Pel, MD      DISCUSSION: Patient is a 69 year old male scheduled for the above procedure.   History includes former smoker (quit 03/27/98), HTN, GERD, TKA (left, 07/05/14).  Patient is not routinely followed by a cardiologist, but was referred to [facility] in late 2015 after preoperative EKG showed NSR, incomplete RBBB, left posterior fascicular block. He had a non-ischemic stress test.   EKG stable.  He denies shortness of breath, cough, fever, chest pain at PAT RN visit.  Last ASA 01/29/20.  Preoperative COVID-19 test is scheduled for 02/03/2020.  Anesthesia team to evaluate on the day of surgery.   VS: BP 133/78   Pulse 69   Temp 36.5 C (Oral)   Resp 17   Ht 6' (1.829 m)   Wt 99.9 kg   SpO2 99%   BMI 29.86 kg/m    PROVIDERS: [employer] [employer] [facility], Yaakov Guthrie, MD is PCP  - Vear Clock, MD is cardiologist. Not seen routinely, but he was evaluated in 2015 for preoperative evaluation for abnormal EKG prior to 2016 TKR. Stress test was non-ischemic.    LABS: Labs reviewed: Acceptable for surgery. (all labs ordered are listed, but only abnormal results are displayed)  Labs Reviewed  BASIC METABOLIC PANEL - Abnormal; Notable for the following components:      Result Value   Glucose, Bld 100 (*)    Creatinine, Ser 1.31 (*)    GFR calc non Af Amer 55 (*)    All other components within normal limits  URINE CULTURE  SURGICAL PCR SCREEN  CBC  URINALYSIS, ROUTINE W REFLEX MICROSCOPIC     EKG: 02/02/20: Normal sinus rhythm Right axis deviation RSR' or QR pattern in V1 suggests right ventricular conduction delay Low voltage limb leads Abnormal ECG Since previous tracing Rate slower  Otherwise no significant change Confirmed by Glenetta Hew [id number]) on 02/02/2020 9:08:04 PM   CV: Carotid US 07/27/18: IMPRESSION: Color duplex indicates minimal heterogeneous and calcified plaque, with no hemodynamically significant stenosis by duplex criteria in the extracranial cerebrovascular circulation.  Nuclear stress test 05/06/14 [facility], scanned under Media tab, Correspondence, 07/05/14):  Impression: 1. Resting ECG: NSR, incomplete right BBB, poor r wave progression and no resting arrhythmias. Stress ECG was non-diagnostic due to low pharmacological stress.  2. Myocardial perfusion imaging is normal. Overall LV systolic functinon was normal without regional wall motion abormalities. The LVEF was 64%.   Echo 05/03/14 [facility], scanned under Media tab, Correspondence, 07/05/14):  Conclusion: 1. LV cavity is normal is size. Normal global wall motion. Grade I (impaired) diastolic dysfunction. Calculated EF 55%.  2. Trileaflet AV with mild AR. Trace MR/TR.   Past Medical History:  Diagnosis Date  . Arthritis   . GERD (gastroesophageal reflux disease)   . Hypertension     Past Surgical History:  Procedure Laterality Date  . COLONOSCOPY    . KNEE ARTHROSCOPY Left   . TOE SURGERY Right   . TONSILLECTOMY    . TOTAL KNEE ARTHROPLASTY Left 07/05/2014   Procedure: TOTAL KNEE ARTHROPLASTY;  Surgeon: Meredith Pel, MD;  Location: [place];  Service: Orthopedics;  Laterality: Left;  Marland Kitchen VASECTOMY      MEDICATIONS: . aspirin EC  81 MG tablet  . atorvastatin (LIPITOR) 10 MG tablet  . losartan (COZAAR) 100 MG tablet  . Multiple Vitamin (MULTIVITAMIN WITH MINERALS) TABS tablet  . terazosin (HYTRIN) 2 MG capsule   No current facility-administered medications for this encounter.    Myra Gianotti, PA-C Surgical Short Stay/Anesthesiology [facility] Phone [phone] [facility] Phone [phone] 02/03/2020 10:17 AM

## 2020-02-04 ENCOUNTER — Telehealth: Payer: Self-pay | Admitting: *Deleted

## 2020-02-04 NOTE — Care Plan (Signed)
RNCM call to patient to discuss his upcoming Right Total Knee Arthroplasty with Dr. Marlou Sa on Monday, 02/07/20. Patient is an Ortho bundle through [facility]. He is agreeable to case management. Discussed all post-op instructions with Billy Fields and his wife. He has all DME needed (FWW, 3in1/BSC). CPM ordered through [facility] and has already been delivered to his home. His wife will be able to assist him at home after discharge. Anticipate HHPT will be needed after short hospital stay. Choice provided and referral made to [facility]. Will continue to follow for questions or needs.

## 2020-02-04 NOTE — Telephone Encounter (Signed)
Ortho bundle pre-op call completed. 

## 2020-02-07 ENCOUNTER — Encounter (HOSPITAL_COMMUNITY)
Admission: RE | Disposition: A | Discharge status: Home / Self Care | Payer: Self-pay | Source: Home / Self Care | Attending: Orthopedic Surgery

## 2020-02-07 ENCOUNTER — Other Ambulatory Visit: Payer: Self-pay

## 2020-02-07 ENCOUNTER — Observation Stay (HOSPITAL_COMMUNITY)
Admission: RE | Admit: 2020-02-07 | Discharge: 2020-02-08 | Disposition: A | Discharge status: Home / Self Care | Payer: Medicare Other | Attending: Orthopedic Surgery | Admitting: Orthopedic Surgery

## 2020-02-07 ENCOUNTER — Ambulatory Visit (HOSPITAL_COMMUNITY): Payer: Medicare Other | Admitting: Anesthesiology

## 2020-02-07 ENCOUNTER — Ambulatory Visit (HOSPITAL_COMMUNITY): Payer: Medicare Other | Admitting: Vascular Surgery

## 2020-02-07 ENCOUNTER — Encounter (HOSPITAL_COMMUNITY): Payer: Self-pay | Admitting: Orthopedic Surgery

## 2020-02-07 DIAGNOSIS — Z96651 Presence of right artificial knee joint: Secondary | ICD-10-CM | POA: Diagnosis not present

## 2020-02-07 DIAGNOSIS — G8918 Other acute postprocedural pain: Secondary | ICD-10-CM | POA: Diagnosis not present

## 2020-02-07 DIAGNOSIS — Z87891 Personal history of nicotine dependence: Secondary | ICD-10-CM | POA: Insufficient documentation

## 2020-02-07 DIAGNOSIS — I10 Essential (primary) hypertension: Secondary | ICD-10-CM | POA: Insufficient documentation

## 2020-02-07 DIAGNOSIS — E785 Hyperlipidemia, unspecified: Secondary | ICD-10-CM | POA: Diagnosis not present

## 2020-02-07 DIAGNOSIS — Z96652 Presence of left artificial knee joint: Secondary | ICD-10-CM | POA: Diagnosis not present

## 2020-02-07 DIAGNOSIS — M1711 Unilateral primary osteoarthritis, right knee: Secondary | ICD-10-CM | POA: Diagnosis not present

## 2020-02-07 HISTORY — DX: Hyperlipidemia, unspecified: E78.5

## 2020-02-07 HISTORY — PX: TOTAL KNEE ARTHROPLASTY: SHX125

## 2020-02-07 SURGERY — ARTHROPLASTY, KNEE, TOTAL
Anesthesia: Monitor Anesthesia Care | Site: Knee | Laterality: Right

## 2020-02-07 MED ORDER — ATORVASTATIN CALCIUM 10 MG PO TABS: 10.0000 mg | ORAL_TABLET | Freq: Every day | ORAL | Status: DC

## 2020-02-07 MED ORDER — CEFAZOLIN SODIUM-DEXTROSE 2-4 GM/100ML-% IV SOLN
2.0000 g | INTRAVENOUS | Status: AC
  Administered 2020-02-07: 2 g via INTRAVENOUS
  Filled 2020-02-07: qty 100

## 2020-02-07 MED ORDER — BUPIVACAINE-EPINEPHRINE (PF) 0.25% -1:200000 IJ SOLN
INTRAMUSCULAR | Status: AC
  Filled 2020-02-07: qty 30

## 2020-02-07 MED ORDER — CHLORHEXIDINE GLUCONATE 0.12 % MT SOLN
15.0000 mL | Freq: Once | OROMUCOSAL | Status: AC
  Administered 2020-02-07: 15 mL via OROMUCOSAL

## 2020-02-07 MED ORDER — POVIDONE-IODINE 7.5 % EX SOLN: Freq: Once | CUTANEOUS | Status: DC

## 2020-02-07 MED ORDER — ONDANSETRON HCL 4 MG/2ML IJ SOLN: 4.0000 mg | Freq: Once | INTRAMUSCULAR | Status: DC | PRN

## 2020-02-07 MED ORDER — POVIDONE-IODINE 10 % EX SWAB: 2.0000 "application " | Freq: Once | CUTANEOUS | Status: DC

## 2020-02-07 MED ORDER — LACTATED RINGERS IV SOLN: INTRAVENOUS | Status: DC

## 2020-02-07 MED ORDER — HYDROMORPHONE HCL 1 MG/ML IJ SOLN: 0.2500 mg | INTRAMUSCULAR | Status: DC | PRN

## 2020-02-07 MED ORDER — BUPIVACAINE IN DEXTROSE 0.75-8.25 % IT SOLN
INTRATHECAL | Status: DC | PRN
  Administered 2020-02-07: 2 mL via INTRATHECAL

## 2020-02-07 MED ORDER — PROPOFOL 500 MG/50ML IV EMUL
INTRAVENOUS | Status: DC | PRN
  Administered 2020-02-07: 75 ug/kg/min via INTRAVENOUS

## 2020-02-07 MED ORDER — OXYCODONE HCL 5 MG PO TABS
5.0000 mg | ORAL_TABLET | ORAL | Status: DC | PRN
  Administered 2020-02-08: 5 mg via ORAL
  Filled 2020-02-07: qty 1

## 2020-02-07 MED ORDER — METHOCARBAMOL 500 MG IVPB - SIMPLE MED
500.0000 mg | Freq: Four times a day (QID) | INTRAVENOUS | Status: DC | PRN
  Filled 2020-02-07: qty 50

## 2020-02-07 MED ORDER — PHENOL 1.4 % MT LIQD: 1.0000 | OROMUCOSAL | Status: DC | PRN

## 2020-02-07 MED ORDER — SODIUM CHLORIDE (PF) 0.9 % IJ SOLN
INTRAMUSCULAR | Status: DC | PRN
  Administered 2020-02-07: 20 mL

## 2020-02-07 MED ORDER — TRANEXAMIC ACID 1000 MG/10ML IV SOLN
2000.0000 mg | Freq: Once | INTRAVENOUS | Status: AC
  Administered 2020-02-07: 2000 mg via TOPICAL
  Filled 2020-02-07: qty 20

## 2020-02-07 MED ORDER — ASPIRIN 81 MG PO CHEW
81.0000 mg | CHEWABLE_TABLET | Freq: Two times a day (BID) | ORAL | Status: DC
  Administered 2020-02-07 – 2020-02-08 (×2): 81 mg via ORAL
  Filled 2020-02-07 (×2): qty 1

## 2020-02-07 MED ORDER — METHOCARBAMOL 500 MG PO TABS: 500.0000 mg | ORAL_TABLET | Freq: Four times a day (QID) | ORAL | Status: DC | PRN

## 2020-02-07 MED ORDER — PROPOFOL 500 MG/50ML IV EMUL
INTRAVENOUS | Status: AC
  Filled 2020-02-07: qty 50

## 2020-02-07 MED ORDER — POVIDONE-IODINE 10 % EX SWAB
2.0000 "application " | Freq: Once | CUTANEOUS | Status: AC
  Administered 2020-02-07: 2 via TOPICAL

## 2020-02-07 MED ORDER — ROPIVACAINE HCL 5 MG/ML IJ SOLN
INTRAMUSCULAR | Status: DC | PRN
  Administered 2020-02-07: 30 mL via PERINEURAL

## 2020-02-07 MED ORDER — FENTANYL CITRATE (PF) 100 MCG/2ML IJ SOLN
INTRAMUSCULAR | Status: DC | PRN
  Administered 2020-02-07 (×2): 50 ug via INTRAVENOUS

## 2020-02-07 MED ORDER — BUPIVACAINE LIPOSOME 1.3 % IJ SUSP
20.0000 mL | Freq: Once | INTRAMUSCULAR | Status: DC
  Filled 2020-02-07: qty 20

## 2020-02-07 MED ORDER — 0.9 % SODIUM CHLORIDE (POUR BTL) OPTIME
TOPICAL | Status: DC | PRN
  Administered 2020-02-07: 1000 mL

## 2020-02-07 MED ORDER — ACETAMINOPHEN 500 MG PO TABS
1000.0000 mg | ORAL_TABLET | Freq: Once | ORAL | Status: AC
  Administered 2020-02-07: 1000 mg via ORAL
  Filled 2020-02-07: qty 2

## 2020-02-07 MED ORDER — TRANEXAMIC ACID-NACL 1000-0.7 MG/100ML-% IV SOLN
1000.0000 mg | INTRAVENOUS | Status: AC
  Administered 2020-02-07: 1000 mg via INTRAVENOUS
  Filled 2020-02-07: qty 100

## 2020-02-07 MED ORDER — ORAL CARE MOUTH RINSE: 15.0000 mL | Freq: Once | OROMUCOSAL | Status: AC

## 2020-02-07 MED ORDER — SODIUM CHLORIDE (PF) 0.9 % IJ SOLN
INTRAMUSCULAR | Status: AC
  Filled 2020-02-07: qty 50

## 2020-02-07 MED ORDER — DEXAMETHASONE SODIUM PHOSPHATE 10 MG/ML IJ SOLN
INTRAMUSCULAR | Status: DC | PRN
  Administered 2020-02-07: 10 mg

## 2020-02-07 MED ORDER — FENTANYL CITRATE (PF) 100 MCG/2ML IJ SOLN
INTRAMUSCULAR | Status: AC
  Filled 2020-02-07: qty 2

## 2020-02-07 MED ORDER — EPHEDRINE SULFATE-NACL 50-0.9 MG/10ML-% IV SOSY
PREFILLED_SYRINGE | INTRAVENOUS | Status: DC | PRN
  Administered 2020-02-07: 5 mg via INTRAVENOUS
  Administered 2020-02-07 (×2): 10 mg via INTRAVENOUS

## 2020-02-07 MED ORDER — ONDANSETRON HCL 4 MG/2ML IJ SOLN
INTRAMUSCULAR | Status: DC | PRN
  Administered 2020-02-07: 4 mg via INTRAVENOUS

## 2020-02-07 MED ORDER — LIDOCAINE 2% (20 MG/ML) 5 ML SYRINGE
INTRAMUSCULAR | Status: AC
  Filled 2020-02-07: qty 5

## 2020-02-07 MED ORDER — PROPOFOL 10 MG/ML IV BOLUS
INTRAVENOUS | Status: AC
  Filled 2020-02-07: qty 20

## 2020-02-07 MED ORDER — SODIUM CHLORIDE 0.9 % IR SOLN
Status: DC | PRN
  Administered 2020-02-07: 3000 mL

## 2020-02-07 MED ORDER — ONDANSETRON HCL 4 MG/2ML IJ SOLN
INTRAMUSCULAR | Status: AC
  Filled 2020-02-07: qty 2

## 2020-02-07 MED ORDER — SODIUM CHLORIDE 0.9 % IV SOLN
INTRAVENOUS | Status: DC | PRN
  Administered 2020-02-07: 20 mL

## 2020-02-07 MED ORDER — OXYCODONE HCL 5 MG/5ML PO SOLN: 5.0000 mg | Freq: Once | ORAL | Status: DC | PRN

## 2020-02-07 MED ORDER — CEFAZOLIN SODIUM-DEXTROSE 2-4 GM/100ML-% IV SOLN
2.0000 g | Freq: Four times a day (QID) | INTRAVENOUS | Status: AC
  Administered 2020-02-07 (×2): 2 g via INTRAVENOUS
  Filled 2020-02-07 (×2): qty 100

## 2020-02-07 MED ORDER — PROPOFOL 10 MG/ML IV BOLUS
INTRAVENOUS | Status: DC | PRN
  Administered 2020-02-07 (×2): 10 mg via INTRAVENOUS
  Administered 2020-02-07: 20 mg via INTRAVENOUS

## 2020-02-07 MED ORDER — MORPHINE SULFATE 4 MG/ML IJ SOLN
INTRAMUSCULAR | Status: DC | PRN
  Administered 2020-02-07: 8 mg

## 2020-02-07 MED ORDER — DOCUSATE SODIUM 100 MG PO CAPS
100.0000 mg | ORAL_CAPSULE | Freq: Two times a day (BID) | ORAL | Status: DC
  Administered 2020-02-08: 100 mg via ORAL
  Filled 2020-02-07 (×2): qty 1

## 2020-02-07 MED ORDER — ISOPROPYL ALCOHOL 70 % SOLN
Status: AC
  Filled 2020-02-07: qty 480

## 2020-02-07 MED ORDER — MORPHINE SULFATE (PF) 4 MG/ML IV SOLN
INTRAVENOUS | Status: AC
  Filled 2020-02-07: qty 2

## 2020-02-07 MED ORDER — HYDROMORPHONE HCL 1 MG/ML IJ SOLN: 0.5000 mg | INTRAMUSCULAR | Status: DC | PRN

## 2020-02-07 MED ORDER — LOSARTAN POTASSIUM 50 MG PO TABS
100.0000 mg | ORAL_TABLET | Freq: Every day | ORAL | Status: DC
  Administered 2020-02-08: 100 mg via ORAL
  Filled 2020-02-07: qty 2

## 2020-02-07 MED ORDER — LIDOCAINE 2% (20 MG/ML) 5 ML SYRINGE
INTRAMUSCULAR | Status: DC | PRN
  Administered 2020-02-07: 20 mg via INTRAVENOUS

## 2020-02-07 MED ORDER — OXYCODONE HCL 5 MG PO TABS: 5.0000 mg | ORAL_TABLET | Freq: Once | ORAL | Status: DC | PRN

## 2020-02-07 MED ORDER — TERAZOSIN HCL 2 MG PO CAPS
2.0000 mg | ORAL_CAPSULE | Freq: Every day | ORAL | Status: DC
  Filled 2020-02-07: qty 1

## 2020-02-07 MED ORDER — EPHEDRINE 5 MG/ML INJ
INTRAVENOUS | Status: AC
  Filled 2020-02-07: qty 10

## 2020-02-07 MED ORDER — BUPIVACAINE-EPINEPHRINE (PF) 0.25% -1:200000 IJ SOLN
INTRAMUSCULAR | Status: DC | PRN
  Administered 2020-02-07: 30 mL

## 2020-02-07 MED ORDER — STERILE WATER FOR IRRIGATION IR SOLN
Status: DC | PRN
  Administered 2020-02-07: 1000 mL

## 2020-02-07 MED ORDER — CLONIDINE HCL (ANALGESIA) 100 MCG/ML EP SOLN
150.0000 ug | Freq: Once | EPIDURAL | Status: AC
  Administered 2020-02-07: 10 ug via INTRA_ARTICULAR
  Filled 2020-02-07: qty 1.5

## 2020-02-07 MED ORDER — MENTHOL 3 MG MT LOZG: 1.0000 | LOZENGE | OROMUCOSAL | Status: DC | PRN

## 2020-02-07 MED ORDER — PROPOFOL 1000 MG/100ML IV EMUL
INTRAVENOUS | Status: AC
  Filled 2020-02-07: qty 100

## 2020-02-07 MED ORDER — VANCOMYCIN HCL 1000 MG IV SOLR
INTRAVENOUS | Status: AC
  Filled 2020-02-07: qty 1000

## 2020-02-07 SURGICAL SUPPLY — 73 items
BAG DECANTER FOR FLEXI CONT (MISCELLANEOUS) ×3 IMPLANT
BAG ZIPLOCK 12X15 (MISCELLANEOUS) IMPLANT
BLADE HEX COATED 2.75 (ELECTRODE) ×3 IMPLANT
BLADE SAG 18X100X1.27 (BLADE) ×3 IMPLANT
BLADE SAW SGTL 13.0X1.19X90.0M (BLADE) ×3 IMPLANT
BLADE SURG 15 STRL LF DISP TIS (BLADE) ×1 IMPLANT
BLADE SURG 15 STRL SS (BLADE) ×2
BNDG COHESIVE 6X5 TAN STRL LF (GAUZE/BANDAGES/DRESSINGS) IMPLANT
BNDG ELASTIC 6X10 VLCR STRL LF (GAUZE/BANDAGES/DRESSINGS) ×3 IMPLANT
BNDG ELASTIC 6X15 VLCR STRL LF (GAUZE/BANDAGES/DRESSINGS) ×3 IMPLANT
BOWL SMART MIX CTS (DISPOSABLE) IMPLANT
CLOSURE STERI-STRIP 1/2X4 (GAUZE/BANDAGES/DRESSINGS) ×1
CLOSURE WOUND 1/2 X4 (GAUZE/BANDAGES/DRESSINGS) ×2
CLSR STERI-STRIP ANTIMIC 1/2X4 (GAUZE/BANDAGES/DRESSINGS) ×2 IMPLANT
COMPONENT TRI CR RETAIN SZ6 RT (Miscellaneous) ×1 IMPLANT
COVER SURGICAL LIGHT HANDLE (MISCELLANEOUS) ×3 IMPLANT
COVER WAND RF STERILE (DRAPES) IMPLANT
CUFF TOURN SGL QUICK 34 (TOURNIQUET CUFF) ×2
CUFF TOURN SGL QUICK 42 (TOURNIQUET CUFF) IMPLANT
CUFF TRNQT CYL 34X4.125X (TOURNIQUET CUFF) ×1 IMPLANT
DECANTER SPIKE VIAL GLASS SM (MISCELLANEOUS) ×3 IMPLANT
DRAPE INCISE IOBAN 66X45 STRL (DRAPES) IMPLANT
DRAPE ORTHO SPLIT 77X108 STRL (DRAPES) ×4
DRAPE SHEET LG 3/4 BI-LAMINATE (DRAPES) ×3 IMPLANT
DRAPE SURG ORHT 6 SPLT 77X108 (DRAPES) ×2 IMPLANT
DRAPE U-SHAPE 47X51 STRL (DRAPES) ×3 IMPLANT
DRSG AQUACEL AG ADV 3.5X10 (GAUZE/BANDAGES/DRESSINGS) ×3 IMPLANT
DRSG AQUACEL AG ADV 3.5X14 (GAUZE/BANDAGES/DRESSINGS) IMPLANT
DURAPREP 26ML APPLICATOR (WOUND CARE) ×6 IMPLANT
ELECT REM PT RETURN 15FT ADLT (MISCELLANEOUS) ×3 IMPLANT
EVACUATOR 1/8 PVC DRAIN (DRAIN) IMPLANT
GAUZE SPONGE 4X4 12PLY STRL (GAUZE/BANDAGES/DRESSINGS) ×3 IMPLANT
GAUZE XEROFORM 5X9 LF (GAUZE/BANDAGES/DRESSINGS) IMPLANT
GLOVE BIOGEL PI IND STRL 8 (GLOVE) ×1 IMPLANT
GLOVE BIOGEL PI INDICATOR 8 (GLOVE) ×2
GLOVE SURG ORTHO 8.0 STRL STRW (GLOVE) ×3 IMPLANT
GOWN STRL REUS W/ TWL LRG LVL3 (GOWN DISPOSABLE) ×3 IMPLANT
GOWN STRL REUS W/TWL LRG LVL3 (GOWN DISPOSABLE) ×6
HANDPIECE INTERPULSE COAX TIP (DISPOSABLE) ×2
HOLDER FOLEY CATH W/STRAP (MISCELLANEOUS) IMPLANT
HOOD PEEL AWAY FLYTE STAYCOOL (MISCELLANEOUS) ×12 IMPLANT
IMMOBILIZER KNEE 20 (SOFTGOODS) ×3
IMMOBILIZER KNEE 20 THIGH 36 (SOFTGOODS) ×1 IMPLANT
INSERT TIB BEARING TRI X3 9 (Insert) ×3 IMPLANT
KIT TURNOVER KIT A (KITS) IMPLANT
KNEE PATELLA ASYMMETRIC 10X35 (Knees) ×3 IMPLANT
KNEE TIBIAL COMPONENT SZ7 (Knees) ×3 IMPLANT
MANIFOLD NEPTUNE II (INSTRUMENTS) ×3 IMPLANT
NDL SAFETY ECLIPSE 18X1.5 (NEEDLE) ×1 IMPLANT
NEEDLE HYPO 18GX1.5 SHARP (NEEDLE) ×2
NEEDLE HYPO 22GX1.5 SAFETY (NEEDLE) ×6 IMPLANT
NS IRRIG 1000ML POUR BTL (IV SOLUTION) ×3 IMPLANT
PACK TOTAL JOINT (CUSTOM PROCEDURE TRAY) ×3 IMPLANT
PADDING CAST SYN 6 (CAST SUPPLIES) ×2
PADDING CAST SYNTHETIC 6X4 NS (CAST SUPPLIES) ×1 IMPLANT
PENCIL SMOKE EVACUATOR (MISCELLANEOUS) IMPLANT
PIN FLUTED HEDLESS FIX 3.5X1/8 (PIN) ×3 IMPLANT
PROTECTOR NERVE ULNAR (MISCELLANEOUS) ×3 IMPLANT
SET HNDPC FAN SPRY TIP SCT (DISPOSABLE) ×1 IMPLANT
STAPLER VISISTAT 35W (STAPLE) IMPLANT
STRIP CLOSURE SKIN 1/2X4 (GAUZE/BANDAGES/DRESSINGS) ×4 IMPLANT
SUT ETHILON 3 0 PS 1 (SUTURE) ×9 IMPLANT
SUT MNCRL AB 3-0 PS2 18 (SUTURE) ×3 IMPLANT
SUT VIC AB 0 CT1 36 (SUTURE) ×9 IMPLANT
SUT VIC AB 1 CT1 36 (SUTURE) ×6 IMPLANT
SUT VIC AB 2-0 CT1 27 (SUTURE) ×4
SUT VIC AB 2-0 CT1 TAPERPNT 27 (SUTURE) ×2 IMPLANT
SYR 30ML LL (SYRINGE) ×9 IMPLANT
TOWEL OR 17X26 10 PK STRL BLUE (TOWEL DISPOSABLE) ×6 IMPLANT
TRAY CATH 16FR W/PLASTIC CATH (SET/KITS/TRAYS/PACK) ×3 IMPLANT
TRAY FOLEY MTR SLVR 16FR STAT (SET/KITS/TRAYS/PACK) ×3 IMPLANT
TRIATHLON CRUCIATE RETAIN SZ6 (Miscellaneous) ×3 IMPLANT
WATER STERILE IRR 1000ML POUR (IV SOLUTION) ×6 IMPLANT

## 2020-02-07 NOTE — H&P (Signed)
TOTAL KNEE ADMISSION H&P  Patient is being admitted for right total knee arthroplasty.  Subjective:  Chief Complaint:right knee pain.  HPI: Billy Fields, 69 y.o. male, has a history of pain and functional disability in the right knee due to arthritis and has failed non-surgical conservative treatments for greater than 12 weeks to includeNSAID's and/or analgesics, corticosteriod injections, viscosupplementation injections, flexibility and strengthening excercises and activity modification.  Onset of symptoms was gradual, starting 8 years ago with gradually worsening course since that time. The patient noted no past surgery on the right knee(s).  Patient currently rates pain in the right knee(s) at 8 out of 10 with activity. Patient has night pain, worsening of pain with activity and weight bearing, pain that interferes with activities of daily living, pain with passive range of motion, crepitus and joint swelling.  Patient has evidence of subchondral sclerosis and joint space narrowing by imaging studies. This patient has had A good result with left total knee replacement. There is no active infection.  Patient Active Problem List   Diagnosis Date Noted  . Degenerative arthritis of knee 07/05/2014   Past Medical History:  Diagnosis Date  . Arthritis   . GERD (gastroesophageal reflux disease)   . Hyperlipidemia   . Hypertension     Past Surgical History:  Procedure Laterality Date  . COLONOSCOPY    . KNEE ARTHROSCOPY Left   . TOE SURGERY Right   . TONSILLECTOMY    . TOTAL KNEE ARTHROPLASTY Left 07/05/2014   Procedure: TOTAL KNEE ARTHROPLASTY;  Surgeon: Meredith Pel, MD;  Location: [place];  Service: Orthopedics;  Laterality: Left;  Marland Kitchen VASECTOMY    . VASECTOMY REVERSAL      Current Facility-Administered Medications  Medication Dose Route Frequency Provider Last Rate Last Admin  . bupivacaine liposome (EXPAREL) 1.3 % injection 266 mg  20 mL Infiltration Once Meredith Pel, MD       . ceFAZolin (ANCEF) IVPB 2g/100 mL premix  2 g Intravenous On Call to OR Magnant, Gerrianne Scale, PA-C      . cloNIDine (DURACLON) 100 mcg/mL inj- OR Only  150 mcg Intra-articular Once Meredith Pel, MD      . lactated ringers infusion   Intravenous Continuous Effie Berkshire, MD 10 mL/hr at 02/07/20 [phone] New Bag at 02/07/20 6010  . povidone-iodine (BETADINE) 7.5 % scrub   Topical Once Magnant, Charles L, PA-C      . povidone-iodine 10 % swab 2 application  2 application Topical Once Magnant, Charles L, PA-C      . tranexamic acid (CYKLOKAPRON) IVPB 1,000 mg  1,000 mg Intravenous To OR Magnant, Charles L, PA-C       Facility-Administered Medications Ordered in Other Encounters  Medication Dose Route Frequency Provider Last Rate Last Admin  . dexamethasone (DECADRON) injection   Infiltration Anesthesia Intra-op Pervis Hocking, DO   10 mg at 02/07/20 0700  . ropivacaine (PF) 5 mg/mL (0.5%) (NAROPIN) injection   Peri-NEURAL Anesthesia Intra-op Pervis Hocking, DO   30 mL at 02/07/20 0700   No Known Allergies  Social History   Tobacco Use  . Smoking status: Former Smoker    Quit date: 03/27/1998    Years since quitting: 21.8  . Smokeless tobacco: Never Used  Substance Use Topics  . Alcohol use: No    Family History  Problem Relation Age of Onset  . Heart attack Mother   . Cirrhosis Father   . CAD Father  Review of Systems  Musculoskeletal: Positive for arthralgias.  All other systems reviewed and are negative.   Objective:  Physical Exam Vitals reviewed.  HENT:     Head: Normocephalic.     Nose: Nose normal.     Mouth/Throat:     Mouth: Mucous membranes are moist.  Eyes:     Pupils: Pupils are equal, round, and reactive to light.  Cardiovascular:     Rate and Rhythm: Normal rate.     Pulses: Normal pulses.  Pulmonary:     Effort: Pulmonary effort is normal.  Abdominal:     General: Abdomen is flat.  Musculoskeletal:     Cervical back: Normal  range of motion.  Skin:    General: Skin is warm.     Capillary Refill: Capillary refill takes less than 2 seconds.  Neurological:     General: No focal deficit present.     Mental Status: He is alert.  Psychiatric:        Mood and Affect: Mood normal.    Examination of the right knee demonstrates intact skin.  Mild valgus alignment.  Mild effusion.  Pedal pulses palpable.  5 degree flexion contracture.  Can flex to about 115.  Collaterals are stable.  No groin pain with internal X rotation of the leg.  Pedal pulses palpable. Vital signs in last 24 hours: Temp:  [98 F (36.7 C)] 98 F (36.7 C) (08/16 0623) Pulse Rate:  [77] 77 (08/16 0623) Resp:  [16] 16 (08/16 0623) BP: (161)/(68) 161/68 (08/16 0623) SpO2:  [98 %] 98 % (08/16 0623) Weight:  [98 kg] 98 kg (08/16 0623)  Labs:   Estimated body mass index is 29.29 kg/m as calculated from the following:   Height as of this encounter: 6' (1.829 m).   Weight as of this encounter: 98 kg.   Imaging Review Plain radiographs demonstrate severe degenerative joint disease of the right knee(s). The overall alignment ismild valgus. The bone quality appears to be excellent for age and reported activity level.      Assessment/Plan:  End stage arthritis, right knee   The patient history, physical examination, clinical judgment of the provider and imaging studies are consistent with end stage degenerative joint disease of the right knee(s) and total knee arthroplasty is deemed medically necessary. The treatment options including medical management, injection therapy arthroscopy and arthroplasty were discussed at length. The risks and benefits of total knee arthroplasty were presented and reviewed. The risks due to aseptic loosening, infection, stiffness, patella tracking problems, thromboembolic complications and other imponderables were discussed. The patient acknowledged the explanation, agreed to proceed with the plan and consent was  signed. Patient is being admitted for inpatient treatment for surgery, pain control, PT, OT, prophylactic antibiotics, VTE prophylaxis, progressive ambulation and ADL's and discharge planning. The patient is planning to be discharged home with home health services we may use press-fit versus cement knee replacement depending on bone quality     Patient's anticipated LOS is less than 2 midnights, meeting these requirements: - Younger than 71 - Lives within 1 hour of care - Has a competent adult at home to recover with post-op recover - NO history of  - Chronic pain requiring opiods  - Diabetes  - Coronary Artery Disease  - Heart failure  - Heart attack  - Stroke  - DVT/VTE  - Cardiac arrhythmia  - Respiratory Failure/COPD  - Renal failure  - Anemia  - Advanced Liver disease

## 2020-02-07 NOTE — Progress Notes (Signed)
Orthopedic Tech Progress Note Patient Details:  Billy Fields 1950/10/28 [id number]  CPM Right Knee CPM Right Knee: On Right Knee Flexion (Degrees): 40 Right Knee Extension (Degrees): 10  Post Interventions Patient Tolerated: Well Instructions Provided: Care of device Ortho Devices Type of Ortho Device: Bone foam zero knee Ortho Device/Splint Interventions: Ordered, Application, Adjustment   Post Interventions Patient Tolerated: Well Instructions Provided: Care of device   Braulio Bosch 02/07/2020, 10:56 AM

## 2020-02-07 NOTE — Anesthesia Postprocedure Evaluation (Signed)
Anesthesia Post Note  Patient: JAICEON COLLISTER  Procedure(s) Performed: RIGHT TOTAL KNEE ARTHROPLASTY (Right Knee)     Patient location during evaluation: PACU Anesthesia Type: Regional, MAC and Spinal Level of consciousness: awake and alert, oriented and patient cooperative Pain management: pain level controlled Vital Signs Assessment: post-procedure vital signs reviewed and stable Respiratory status: spontaneous breathing, nonlabored ventilation and respiratory function stable Cardiovascular status: blood pressure returned to baseline and stable Postop Assessment: no backache, no headache and spinal receding Anesthetic complications: no   No complications documented.  Last Vitals:  Vitals:   02/07/20 1100 02/07/20 1127  BP: 131/74 113/87  Pulse: 66 69  Resp: 17 14  Temp: 36.5 C   SpO2: 99% 98%    Last Pain:  Vitals:   02/07/20 1100  TempSrc:   PainSc: 0-No pain                 Pervis Hocking

## 2020-02-07 NOTE — Evaluation (Signed)
Physical Therapy Evaluation Patient Details Name: Billy Fields MRN: [id number] DOB: 07-13-1950 Today's Date: 02/07/2020   History of Present Illness  69 y.o. male with HTN, L TKA in 2016 and R knee OA, s/p R TKA 02/07/20.  Clinical Impression  Pt is s/p R TKA resulting in the deficits listed below (see PT Problem List). Pt POD0 with 5/10 resting R knee pain. Pt able to demonstrate SLR without extensor lag noted and verbalizes decreased sensation over incision site and medial knee joint. Pt completes mobility without physical assistance, requiring cues for hand placement to improve safety and function. Pt ambulates 80 ft in hallway with RW using step to gait, no knee instability noted, and pt denies increase in pain/dizziness and without complaints. Pt returned to room and supine in bed with spouse at bedside. RN notified of SpO2 95-97% on RA with mobility so removed O2 and CPM removed for ambulation and not returned- RN reports she will place CPM on. Pt is well equipped at home and previous TKA so knowledgeable about rehab process. Pt will benefit from skilled PT to increase their independence and safety with mobility to allow discharge to the venue listed below.      Follow Up Recommendations Follow surgeon's recommendation for DC plan and follow-up therapies    Equipment Recommendations  None recommended by PT    Recommendations for Other Services       Precautions / Restrictions Precautions Precautions: Fall Restrictions Weight Bearing Restrictions: No      Mobility  Bed Mobility Overal bed mobility: Needs Assistance Bed Mobility: Supine to Sit;Sit to Supine  Supine to sit: Min guard Sit to supine: Min guard   General bed mobility comments: increased time to mobilize RLE  Transfers Overall transfer level: Needs assistance Equipment used: Rolling walker (2 wheeled) Transfers: Sit to/from Stand Sit to Stand: Min guard  General transfer comment: cues for BUE assisting to  power up and avoiding pulling on RW to rise, able to rise without physical assist, cues for controlled descent when returning to sitting with RLE extended  Ambulation/Gait Ambulation/Gait assistance: Min guard Gait Distance (Feet): 80 Feet Assistive device: Rolling walker (2 wheeled) Gait Pattern/deviations: Step-to pattern;Decreased stance time - right;Decreased stride length;Decreased weight shift to right;Narrow base of support Gait velocity: decreased   General Gait Details: step to pattern, slightly decreased weight-shift to RLE and stance time, maintains narrow BOS despite cues; cued to widen BOS and to avoid lifting RW to progress, no unsteadiness or loss of balance  Stairs            Wheelchair Mobility    Modified Rankin (Stroke Patients Only)       Balance Overall balance assessment: Needs assistance Sitting-balance support: Feet supported;No upper extremity supported Sitting balance-Leahy Scale: Good Sitting balance - Comments: seated EOB   Standing balance support: During functional activity;Bilateral upper extremity supported Standing balance-Leahy Scale: Poor Standing balance comment: reliant on RW          Pertinent Vitals/Pain Pain Assessment: 0-10 Pain Score: 5  Pain Location: R knee Pain Descriptors / Indicators: Aching Pain Intervention(s): Limited activity within patient's tolerance;Monitored during session;Premedicated before session;Ice applied    Home Living Family/patient expects to be discharged to:: Private residence Living Arrangements: Spouse/significant other Available Help at Discharge: Family;Available 24 hours/day Type of Home: House Home Access: Level entry     Home Layout: Two level;Able to live on main level with bedroom/bathroom Home Equipment: Gilford Rile - 2 wheels;Cane - single point;Crutches;Bedside commode;Grab bars -  toilet;Grab bars - tub/shower      Prior Function Level of Independence: Independent         Comments:  Pt reports independent with ADLs, ambulates community distances without AD, denies falls, drives, plays golf recreationally.     Hand Dominance        Extremity/Trunk Assessment   Upper Extremity Assessment Upper Extremity Assessment: Overall WFL for tasks assessed    Lower Extremity Assessment Lower Extremity Assessment: RLE deficits/detail RLE Deficits / Details: ankle 5/5, hip 4+/5, knee 4+/5; knee ROM ~0-90; decreased sensation to light touch over incision and slightly medial knee joint; dressing securely on and intact    Cervical / Trunk Assessment Cervical / Trunk Assessment: Normal  Communication   Communication: No difficulties  Cognition Arousal/Alertness: Awake/alert Behavior During Therapy: WFL for tasks assessed/performed Overall Cognitive Status: Within Functional Limits for tasks assessed     General Comments General comments (skin integrity, edema, etc.): on RA with SpO2 95-97% with mobility    Exercises     Assessment/Plan    PT Assessment Patient needs continued PT services  PT Problem List Decreased strength;Decreased range of motion;Decreased activity tolerance;Decreased balance;Decreased mobility;Decreased coordination;Decreased knowledge of use of DME;Impaired sensation;Pain       PT Treatment Interventions DME instruction;Gait training;Functional mobility training;Therapeutic activities;Therapeutic exercise;Balance training;Cognitive remediation;Patient/family education;Modalities    PT Goals (Current goals can be found in the Care Plan section)  Acute Rehab PT Goals Patient Stated Goal: return home and play golf once healed up PT Goal Formulation: With patient Time For Goal Achievement: 02/21/20 Potential to Achieve Goals: Good    Frequency BID   Barriers to discharge        Co-evaluation               AM-PAC PT "6 Clicks" Mobility  Outcome Measure Help needed turning from your back to your side while in a flat bed without using  bedrails?: None Help needed moving from lying on your back to sitting on the side of a flat bed without using bedrails?: None Help needed moving to and from a bed to a chair (including a wheelchair)?: None Help needed standing up from a chair using your arms (e.g., wheelchair or bedside chair)?: None Help needed to walk in hospital room?: None Help needed climbing 3-5 steps with a railing? : A Little 6 Click Score: 23    End of Session Equipment Utilized During Treatment: Gait belt Activity Tolerance: Patient tolerated treatment well Patient left: in bed;with call bell/phone within reach;with family/visitor present Nurse Communication: Mobility status;Other (comment) (CPM removed to ambulate, O2 removed with SpO2 95-97% with ambulation) PT Visit Diagnosis: Other abnormalities of gait and mobility (R26.89);Muscle weakness (generalized) (M62.81);Pain Pain - Right/Left: Right Pain - part of body: Knee    Time: 6213-0865 PT Time Calculation (min) (ACUTE ONLY): 31 min   Charges:   PT Evaluation $PT Eval Low Complexity: 1 Low PT Treatments $Gait Training: 8-22 mins         Tori Zandyr Barnhill PT, DPT 02/07/20, 1:56 PM

## 2020-02-07 NOTE — Anesthesia Procedure Notes (Signed)
Spinal  Patient location during procedure: OR Start time: 02/07/2020 7:30 AM End time: 02/07/2020 7:39 AM Staffing Performed: anesthesiologist  Anesthesiologist: Pervis Hocking, DO Preanesthetic Checklist Completed: patient identified, IV checked, risks and benefits discussed, surgical consent, monitors and equipment checked, pre-op evaluation and timeout performed Spinal Block Patient position: sitting Prep: DuraPrep and site prepped and draped Patient monitoring: cardiac monitor, continuous pulse ox and blood pressure Approach: midline Location: L3-4 Injection technique: single-shot Needle Needle type: Pencan  Needle gauge: 24 G Needle length: 9 cm Assessment Sensory level: T6 Additional Notes Functioning IV was confirmed and monitors were applied. Sterile prep and drape, including hand hygiene and sterile gloves were used. The patient was positioned and the spine was prepped. The skin was anesthetized with lidocaine.  Free flow of clear CSF was obtained prior to injecting local anesthetic into the CSF.  The spinal needle aspirated freely following injection.  The needle was carefully withdrawn.  The patient tolerated the procedure well.

## 2020-02-07 NOTE — Care Plan (Signed)
Ortho Bundle Case Management Note  Patient Details  Name: ADMIRAL MARCUCCI MRN: [id number] Date of Birth: [birth year]  [facility] call to patient to discuss his upcoming Right Total Knee Arthroplasty with Dr. Marlou Sa on Monday, 02/07/20. Patient is an Ortho bundle through [facility]. He is agreeable to case management. Discussed all post-op instructions with Mr. Mish and his wife. He has all DME needed (FWW, 3in1/BSC). CPM ordered through [place] and has already been delivered to his home. His wife will be able to assist him at home after discharge. Anticipate HHPT will be needed after short hospital stay. Choice provided and referral made to [facility]. Will continue to follow for questions or needs.                DME Arranged:   (Patient has all DME needed (FWW, 3in1/BSC)) DME Agency:     HH Arranged:  PT [place]:  [facility] (now [facility])  Additional Comments: Please contact me with any questions of if this plan should need to change.  Jamse Arn, RN, BSN, [employer]  [phone] 02/07/2020, 2:34 PM

## 2020-02-07 NOTE — Transfer of Care (Signed)
Immediate Anesthesia Transfer of Care Note  Patient: Billy Fields  Procedure(s) Performed: RIGHT TOTAL KNEE ARTHROPLASTY (Right Knee)  Patient Location: PACU  Anesthesia Type:Regional and Spinal  Level of Consciousness: awake, alert  and oriented  Airway & Oxygen Therapy: Patient Spontanous Breathing and Patient connected to nasal cannula oxygen  Post-op Assessment: Report given to RN and Post -op Vital signs reviewed and stable  Post vital signs: Reviewed and stable  Last Vitals:  Vitals Value Taken Time  BP 121/66 02/07/20 1027  Temp    Pulse 70 02/07/20 1029  Resp 18 02/07/20 1029  SpO2 98 % 02/07/20 1029  Vitals shown include unvalidated device data.  Last Pain:  Vitals:   02/07/20 0623  TempSrc: Oral         Complications: No complications documented.

## 2020-02-07 NOTE — Brief Op Note (Signed)
   02/07/2020  9:56 AM  PATIENT:  Billy Fields  69 y.o. male  PRE-OPERATIVE DIAGNOSIS:  right knee osteoarthritis  POST-OPERATIVE DIAGNOSIS:  right knee osteoarthritis  PROCEDURE:  Procedure(s): RIGHT TOTAL KNEE ARTHROPLASTY  SURGEON:  Surgeon(s): Meredith Pel, MD  ASSISTANT: magnant pa  ANESTHESIA:   spinal  EBL: 50 ml    Total I/O In: 1000 [I.V.:1000] Out: 50 [Blood:50]  BLOOD ADMINISTERED: none  DRAINS: none   LOCAL MEDICATIONS USED:  Marcaine mso4 clonidine exparel  SPECIMEN:  No Specimen  COUNTS:  YES  TOURNIQUET:   Total Tourniquet Time Documented: area (Right) - 74 minutes Total: area (Right) - 74 minutes   DICTATION: .Other Dictation: Dictation Number [id number]  PLAN OF CARE: Admit for overnight observation  PATIENT DISPOSITION:  PACU - hemodynamically stable

## 2020-02-07 NOTE — Op Note (Signed)
NAME: Billy Fields, Billy Fields MEDICAL RECORD [id number] ACCOUNT [id number] DATE OF BIRTH:06-18-1951 FACILITY: [facility] LOCATION: [id number] PHYSICIAN:Brienne Liguori Randel Pigg, MD  OPERATIVE REPORT  DATE OF PROCEDURE:  02/07/2020  PREOPERATIVE DIAGNOSIS:  Right knee arthritis.  POSTOPERATIVE DIAGNOSIS:  Right knee arthritis.  PROCEDURE:  Right total knee replacement using [employer] Triathlon press-fit total knee components.  Posterior cruciate retaining size 6 femur, 7 tibia, 9 mm deep dish polyethylene insert, 35 mm 3-peg press-fit patella.  SURGEON:  Meredith Pel, MD  ASSISTANT:  Annie Main, MD   INDICATIONS:  Billy Fields is a 69 year old patient with end-stage right knee arthritis, who presents for right total knee replacement after explanation of risks and benefits.  PROCEDURE IN DETAIL:  The patient was brought to the operating room where spinal anesthetic was induced.  Preoperative antibiotics administered.  Timeout was called.  Right leg prescrubbed with alcohol and Betadine, allowed to air dry.  Prepped with  DuraPrep solution and draped in sterile manner.  Ioban used to cover the operative field.  The leg was elevated, exsanguinated with the Esmarch wrap, tourniquet inflated for a total tourniquet time of 74 minutes.  Anterior approach to knee was made.   Skin and subcutaneous tissue were sharply divided.  IrriSept solution used after the incision.  Arthrotomy created and marked with #1 Vicryl suture on the medial side.  IrriSept solution used at this time as well.  Lateral patellofemoral ligament was  released.  Fat pad partially excised.  Minimal soft tissue dissection was performed on the medial proximal tibial plateau due to the patient's preoperative valgus deformity.  Capsule was released around the lateral tibial plateau.  Next, soft tissue was  removed from the anterior distal femur.  Patella was everted.  Osteophytes were removed.  Knee was flexed.  Intramedullary alignment was then used to  cut the proximal tibia 9 mm off the least affected medial tibial plateau.  Posterior neurovascular  structures and collaterals were protected.  Attention was then directed towards the femur.  A 12 mm cut was chosen because of the patient's preoperative flexion contracture as well as the wear on the lateral femoral condyle.  In order to achieve a  reasonable bony cut a 12 mm cut was required.  With that 12 mm cut the patient had good alignment and achieved full extension with good stability with a 9 mm spacer.  The anterior, posterior, and chamfer cuts were made after sizing the femur to a size 6.   The tibia was then keel punched.  Trial components placed.  The patient had excellent flexion and extension with no liftoff with a 9 mm spacer in place.  Due to the patient's preoperative valgus deformity, not unexpected to have to have to perform a  lateral release.  This improved patellar tracking to no thumbs technique.  Patella was then cut down from 27-17 mm.  A trial patellar button was placed.  Same stability parameters were maintained with the trial components in position.  Full extension,  full flexion, excellent patellar tracking using no thumbs technique.  Good stability to varus and valgus stress at 0, 30 and 90 degrees.  Trial components were removed.  Thorough irrigation performed with 3 liters of irrigating solution followed by  tranexamic acid allowed to sit for 3 minutes within the incision as well as IrriSept solution left to set for 3 minutes.  Capsule was anesthetized using Marcaine, morphine, and Exparel on both sides.  After this, IrriSept solution was utilized and the  components were  press fit into position with excellent press-fit obtained.  The tourniquet was released.  Bleeding points encountered were controlled using electrocautery.  Thorough irrigation again performed with pouring irrigation.  Bleeding points  encountered controlled using electrocautery.  The IrriSept solution utilized  and then the incision was closed over a bolster using #1 Vicryl suture followed by interrupted, inverted 0 Vicryl suture, 2-0 Vicryl suture and a 3-0 Monocryl.  Steri-Strips,  Aquacel dressing applied.  A solution of Marcaine, morphine, clonidine injected into the knee for postop pain relief.  A knee immobilizer placed.  The patient tolerated the procedure well without immediate complications.  He was transferred to the  recovery room in stable condition.  Billy Fields's assistance was required at all times during the case for retraction, opening and closing.  His assistance was a medical necessity.  CN/NUANCE  D:02/07/2020 T:02/07/2020 JOB:[id number]

## 2020-02-07 NOTE — Anesthesia Procedure Notes (Signed)
Anesthesia Regional Block: Adductor canal block   Pre-Anesthetic Checklist: ,, timeout performed, Correct Patient, Correct Site, Correct Laterality, Correct Procedure, Correct Position, site marked, Risks and benefits discussed,  Surgical consent,  Pre-op evaluation,  At surgeon's request and post-op pain management  Laterality: Right  Prep: Maximum Sterile Barrier Precautions used, chloraprep       Needles:  Injection technique: Single-shot  Needle Type: Echogenic Stimulator Needle     Needle Length: 9cm  Needle Gauge: 22     Additional Needles:   Procedures:,,,, ultrasound used (permanent image in chart),,,,  Narrative:  Start time: 02/07/2020 6:55 AM End time: 02/07/2020 7:00 AM Injection made incrementally with aspirations every 5 mL.  Performed by: Personally  Anesthesiologist: Pervis Hocking, DO  Additional Notes: Monitors applied. No increased pain on injection. No increased resistance to injection. Injection made in 5cc increments. Good needle visualization. Patient tolerated procedure well.

## 2020-02-08 ENCOUNTER — Encounter (HOSPITAL_COMMUNITY): Payer: Self-pay | Admitting: Orthopedic Surgery

## 2020-02-08 DIAGNOSIS — I10 Essential (primary) hypertension: Secondary | ICD-10-CM | POA: Diagnosis not present

## 2020-02-08 DIAGNOSIS — M1711 Unilateral primary osteoarthritis, right knee: Secondary | ICD-10-CM | POA: Diagnosis not present

## 2020-02-08 DIAGNOSIS — Z96652 Presence of left artificial knee joint: Secondary | ICD-10-CM | POA: Diagnosis not present

## 2020-02-08 DIAGNOSIS — Z87891 Personal history of nicotine dependence: Secondary | ICD-10-CM | POA: Diagnosis not present

## 2020-02-08 MED ORDER — METHOCARBAMOL 500 MG PO TABS: 500.0000 mg | ORAL_TABLET | Freq: Three times a day (TID) | ORAL | 0 refills | Status: DC | PRN

## 2020-02-08 MED ORDER — OXYCODONE HCL 5 MG PO TABS: 5.0000 mg | ORAL_TABLET | ORAL | 0 refills | Status: DC | PRN

## 2020-02-08 MED ORDER — ASPIRIN 81 MG PO CHEW: 81.0000 mg | CHEWABLE_TABLET | Freq: Two times a day (BID) | ORAL | 0 refills | Status: DC

## 2020-02-08 NOTE — Plan of Care (Signed)

## 2020-02-08 NOTE — Progress Notes (Signed)
Bone foam put on patient.  

## 2020-02-08 NOTE — Progress Notes (Signed)
Offered to put bone foam on patient. Patient was only able to tolerate it for two minutes. Will try again later.

## 2020-02-08 NOTE — Progress Notes (Signed)
Bone foam taken off at 6 am, put back on at 6:15 am, and taken off again at 6:30 am.

## 2020-02-08 NOTE — Progress Notes (Signed)
Physical Therapy Treatment Patient Details Name: Billy Fields MRN: [id number] DOB: 10-Feb-1951 Today's Date: 02/08/2020    History of Present Illness 69 y.o. male with HTN, L TKA in 2016 and R knee OA, s/p R TKA 02/07/20.    PT Comments    POD # 1 am session Assisted OOB to amb inhallway Then returned to room to perform some TE's following HEP handout.  Instructed on proper tech, freq as well as use of ICE.   Pt will need another PT session to complete HEP and mobility safety.   Follow Up Recommendations  Follow surgeon's recommendation for DC plan and follow-up therapies     Equipment Recommendations  None recommended by PT    Recommendations for Other Services       Precautions / Restrictions Precautions Precautions: Fall Precaution Comments: instructed no pillow under knee Restrictions Weight Bearing Restrictions: No RLE Weight Bearing: Weight bearing as tolerated    Mobility  Bed Mobility Overal bed mobility: Needs Assistance Bed Mobility: Supine to Sit     Supine to sit: Min guard     General bed mobility comments: demonstarted and instructed how to use belt to self assist LE  Transfers Overall transfer level: Needs assistance Equipment used: Rolling walker (2 wheeled) Transfers: Sit to/from Stand Sit to Stand: Supervision;Min guard         General transfer comment: 255 VC's on proper hand placement and safety with turns  Ambulation/Gait Ambulation/Gait assistance: Supervision;Min guard Gait Distance (Feet): 75 Feet Assistive device: Rolling walker (2 wheeled) Gait Pattern/deviations: Step-to pattern;Decreased stance time - right;Decreased stride length;Decreased weight shift to right;Narrow base of support Gait velocity: decreased   General Gait Details: 25% VC's on proper upright posture and proper walker to self distance   Stairs             Wheelchair Mobility    Modified Rankin (Stroke Patients Only)       Balance                                             Cognition Arousal/Alertness: Awake/alert Behavior During Therapy: WFL for tasks assessed/performed                                   General Comments: AxO x 3 pleasant      Exercises   Total Knee Replacement TE's following HEP handout 10 reps B LE ankle pumps 05 reps towel squeezes 05 reps knee presses 05 reps heel slides  05 reps SAQ's 05 reps SLR's 05 reps ABD Educated on use of gait belt to assist with TE's Followed by ICE     General Comments        Pertinent Vitals/Pain Pain Score: 5  Pain Location: R knee Pain Descriptors / Indicators: Aching;Tightness;Tender;Operative site guarding Pain Intervention(s): Monitored during session;Premedicated before session;Repositioned;Ice applied    Home Living                      Prior Function            PT Goals (current goals can now be found in the care plan section) Progress towards PT goals: Progressing toward goals    Frequency           PT Plan Current plan remains appropriate  Co-evaluation              AM-PAC PT "6 Clicks" Mobility   Outcome Measure  Help needed turning from your back to your side while in a flat bed without using bedrails?: None Help needed moving from lying on your back to sitting on the side of a flat bed without using bedrails?: A Little Help needed moving to and from a bed to a chair (including a wheelchair)?: A Little   Help needed to walk in hospital room?: A Little Help needed climbing 3-5 steps with a railing? : A Little 6 Click Score: 16    End of Session Equipment Utilized During Treatment: Gait belt Activity Tolerance: Patient tolerated treatment well Patient left: in chair;with call bell/phone within reach;with family/visitor present Nurse Communication: Mobility status PT Visit Diagnosis: Other abnormalities of gait and mobility (R26.89);Muscle weakness (generalized)  (M62.81);Pain Pain - Right/Left: Right Pain - part of body: Knee     Time: 1050-1115 PT Time Calculation (min) (ACUTE ONLY): 25 min  Charges:  $Gait Training: 8-22 mins $Therapeutic Exercise: 8-22 mins                     Rica Koyanagi  PTA Acute  Rehabilitation Services Pager      [phone] Office      [phone]

## 2020-02-08 NOTE — Progress Notes (Signed)
Pt stable - pain ok Ankle df ok foot perfused right Plan dc to home after final pt later and stair work

## 2020-02-08 NOTE — TOC Initial Note (Signed)
Transition of Care [facility]) - Initial/Assessment Note    Patient Details  Name: Billy Fields MRN: [id number] Date of Birth: [birth year]  Transition of Care (TOC) CM/SW Contact:    Joaquin Courts, RN Phone Number: 02/08/2020, 11:04 AM  Clinical Narrative:    CM spoke with patient at bedside. Patient reports has all necessary dme. Patient set up with [facility] for [place].                Expected Discharge Plan: [place] Barriers to Discharge: No Barriers Identified   Patient Goals and CMS Choice Patient states their goals for this hospitalization and ongoing recovery are:: to go home with therapy CMS Medicare.gov Compare Post Acute Care list provided to:: Patient Choice offered to / list presented to : Patient  Expected Discharge Plan and Services Expected Discharge Plan: [place]   Discharge Planning Services: CM Consult Post Acute Care Choice: [place] arrangements for the past 2 months: Single Family Home Expected Discharge Date: 02/08/20               DME Arranged: N/A DME Agency: NA       HH Arranged: PT HH Agency: [facility] [employer]     Representative spoke with at [place]: pre-arranged in md office  Prior Living Arrangements/Services Living arrangements for the past 2 months: [place]   Patient language and need for interpreter reviewed:: Yes Do you feel safe going back to the place where you live?: Yes      Need for Family Participation in Patient Care: Yes (Comment) Care giver support system in place?: Yes (comment)   Criminal Activity/Legal Involvement Pertinent to Current Situation/Hospitalization: No - Comment as needed  Activities of Daily Living Home Assistive Devices/Equipment: Raised toilet seat with rails, Walker (specify type), Grab bars in shower, Blood pressure cuff (front wheel) ADL Screening (condition at time of admission) Patient's cognitive ability  adequate to safely complete daily activities?: Yes Is the patient deaf or have difficulty hearing?: No Does the patient have difficulty seeing, even when wearing glasses/contacts?: No Does the patient have difficulty concentrating, remembering, or making decisions?: No Patient able to express need for assistance with ADLs?: No Does the patient have difficulty dressing or bathing?: No Independently performs ADLs?: Yes (appropriate for developmental age) Does the patient have difficulty walking or climbing stairs?: No Weakness of Legs: Right Weakness of Arms/Hands: None  Permission Sought/Granted                  Emotional Assessment Appearance:: Appears stated age Attitude/Demeanor/Rapport: Engaged Affect (typically observed): Accepting Orientation: : Oriented to Self, Oriented to Place, Oriented to  Time, Oriented to Situation   Psych Involvement: No (comment)  Admission diagnosis:  S/P total knee arthroplasty, right [Z96.651] Patient Active Problem List   Diagnosis Date Noted  . S/P total knee arthroplasty, right 02/07/2020  . Degenerative arthritis of knee 07/05/2014   PCP:  [facility] [place]:   [place] [address], [place] - [place] [address] Phone: [phone] Fax: [phone]  [facility] for [place], [place] [address] [place] [address] Phone: [phone] Fax: [phone]  [id number] - [place], [place] [address] Phone: [phone] Fax: [phone]     Social Determinants of Health (  SDOH) Interventions    Readmission Risk Interventions No flowsheet data found.

## 2020-02-08 NOTE — Progress Notes (Signed)
Physical Therapy Treatment Patient Details Name: Billy Fields MRN: [id number] DOB: [birth year] Today's Date: 02/08/2020    History of Present Illness 69 y.o. male with HTN, L TKA in 2016 and R knee OA, s/p R TKA 02/07/20.    PT Comments    POD # 1 pm session Assisted OOB to amb an increased distance.  General Gait Details: <25% VC's on proper upright posture and proper walker to self distance.  Addressed all mobility questions, discussed appropriate activity, educated on use of ICE.  Pt ready for D/C to home.   Follow Up Recommendations  Follow surgeons recommendation for DC plan and follow-up therapies     Equipment Recommendations  None recommended by PT    Recommendations for Other Services       Precautions / Restrictions Precautions Precautions: Fall Precaution Comments: instructed no pillow under knee Restrictions Weight Bearing Restrictions: No RLE Weight Bearing: Weight bearing as tolerated    Mobility  Bed Mobility Overal bed mobility: Needs Assistance Bed Mobility: Supine to Sit     Supine to sit: Min guard     General bed mobility comments: demonstarted and instructed how to use belt to self assist LE  Transfers Overall transfer level: Needs assistance Equipment used: Rolling walker (2 wheeled) Transfers: Sit to/from Stand Sit to Stand: Supervision;Min guard         General transfer comment: 25% VC's on proper hand placement and safety with turns  Ambulation/Gait Ambulation/Gait assistance: Supervision;Min guard Gait Distance (Feet): 95 Feet Assistive device: Rolling walker (2 wheeled) Gait Pattern/deviations: Step-to pattern;Decreased stance time - right;Decreased stride length;Decreased weight shift to right;Narrow base of support Gait velocity: decreased   General Gait Details: 25% VC's on proper upright posture and proper walker to self distance   Stairs             Wheelchair Mobility    Modified Rankin (Stroke Patients  Only)       Balance                                            Cognition Arousal/Alertness: Awake/alert Behavior During Therapy: WFL for tasks assessed/performed                                   General Comments: AxO x 3 pleasant      Exercises      General Comments        Pertinent Vitals/Pain Pain Score: 5  Pain Location: R knee Pain Descriptors / Indicators: Aching;Tightness;Tender;Operative site guarding Pain Intervention(s): Monitored during session;Premedicated before session;Repositioned;Ice applied    Home Living                      Prior Function            PT Goals (current goals can now be found in the care plan section) Progress towards PT goals: Progressing toward goals    Frequency           PT Plan Current plan remains appropriate    Co-evaluation              AM-PAC PT "6 Clicks" Mobility   Outcome Measure  Help needed turning from your back to your side while in a flat bed without using bedrails?: None Help needed moving  from lying on your back to sitting on the side of a flat bed without using bedrails?: A Little Help needed moving to and from a bed to a chair (including a wheelchair)?: A Little   Help needed to walk in hospital room?: A Little Help needed climbing 3-5 steps with a railing? : A Little 6 Click Score: 16    End of Session Equipment Utilized During Treatment: Gait belt Activity Tolerance: Patient tolerated treatment well Patient left: in chair;with call bell/phone within reach;with family/visitor present Nurse Communication: Mobility status PT Visit Diagnosis: Other abnormalities of gait and mobility (R26.89);Muscle weakness (generalized) (M62.81);Pain Pain - Right/Left: Right Pain - part of body: Knee     Time: 1300-1320 PT Time Calculation (min) (ACUTE ONLY): 20 min  Charges:  $Gait Training: 8-22 mins                      Rica Koyanagi  PTA Acute   Rehabilitation Services Pager      [phone] Office      [phone]

## 2020-02-09 ENCOUNTER — Telehealth: Payer: Self-pay | Admitting: *Deleted

## 2020-02-09 DIAGNOSIS — E785 Hyperlipidemia, unspecified: Secondary | ICD-10-CM | POA: Diagnosis not present

## 2020-02-09 DIAGNOSIS — I10 Essential (primary) hypertension: Secondary | ICD-10-CM | POA: Diagnosis not present

## 2020-02-09 DIAGNOSIS — K219 Gastro-esophageal reflux disease without esophagitis: Secondary | ICD-10-CM | POA: Diagnosis not present

## 2020-02-09 DIAGNOSIS — Z96653 Presence of artificial knee joint, bilateral: Secondary | ICD-10-CM | POA: Diagnosis not present

## 2020-02-09 DIAGNOSIS — Z471 Aftercare following joint replacement surgery: Secondary | ICD-10-CM | POA: Diagnosis not present

## 2020-02-09 NOTE — Telephone Encounter (Signed)
Attempted Ortho bundle D/C call to patient. Left message on home phone regarding scheduled OPPT appointment with [facility] PT as well as request to call CM back.

## 2020-02-10 ENCOUNTER — Telehealth: Payer: Self-pay

## 2020-02-10 ENCOUNTER — Telehealth: Payer: Self-pay | Admitting: *Deleted

## 2020-02-10 DIAGNOSIS — E785 Hyperlipidemia, unspecified: Secondary | ICD-10-CM | POA: Diagnosis not present

## 2020-02-10 DIAGNOSIS — I10 Essential (primary) hypertension: Secondary | ICD-10-CM | POA: Diagnosis not present

## 2020-02-10 DIAGNOSIS — Z96653 Presence of artificial knee joint, bilateral: Secondary | ICD-10-CM | POA: Diagnosis not present

## 2020-02-10 DIAGNOSIS — K219 Gastro-esophageal reflux disease without esophagitis: Secondary | ICD-10-CM | POA: Diagnosis not present

## 2020-02-10 DIAGNOSIS — Z471 Aftercare following joint replacement surgery: Secondary | ICD-10-CM | POA: Diagnosis not present

## 2020-02-10 MED ORDER — ASPIRIN 81 MG PO CHEW: 81.0000 mg | CHEWABLE_TABLET | Freq: Every day | ORAL | 0 refills | Status: AC

## 2020-02-10 NOTE — Telephone Encounter (Signed)
Rx in chart is for 1 po bid. Should he be taking 1 or 2 a day?

## 2020-02-10 NOTE — Telephone Encounter (Signed)
Received fax from [facility] asking for 90 day supply for 81mg  aspirin for patient. Please advise.

## 2020-02-10 NOTE — Telephone Encounter (Signed)
2nd attempt at Ortho bundle D/C call to patient. Left another VM. Did speak with his wife and she confirmed he is doing well. HHPT has been out and doing well. Updated on OPPT now scheduled for Monday, 02/21/20 at 2:00 pm with [facility] PT.

## 2020-02-10 NOTE — Telephone Encounter (Signed)
Submitted

## 2020-02-10 NOTE — Telephone Encounter (Signed)
Okay by me.

## 2020-02-10 NOTE — Addendum Note (Signed)
Addended byLaurann [place] on: 02/10/2020 04:15 PM   Modules accepted: Orders

## 2020-02-11 DIAGNOSIS — Z96653 Presence of artificial knee joint, bilateral: Secondary | ICD-10-CM | POA: Diagnosis not present

## 2020-02-11 DIAGNOSIS — Z471 Aftercare following joint replacement surgery: Secondary | ICD-10-CM | POA: Diagnosis not present

## 2020-02-11 DIAGNOSIS — I10 Essential (primary) hypertension: Secondary | ICD-10-CM | POA: Diagnosis not present

## 2020-02-11 DIAGNOSIS — K219 Gastro-esophageal reflux disease without esophagitis: Secondary | ICD-10-CM | POA: Diagnosis not present

## 2020-02-11 DIAGNOSIS — E785 Hyperlipidemia, unspecified: Secondary | ICD-10-CM | POA: Diagnosis not present

## 2020-02-12 DIAGNOSIS — M1711 Unilateral primary osteoarthritis, right knee: Secondary | ICD-10-CM | POA: Diagnosis not present

## 2020-02-14 ENCOUNTER — Telehealth: Payer: Self-pay | Admitting: *Deleted

## 2020-02-14 DIAGNOSIS — K219 Gastro-esophageal reflux disease without esophagitis: Secondary | ICD-10-CM | POA: Diagnosis not present

## 2020-02-14 DIAGNOSIS — Z96653 Presence of artificial knee joint, bilateral: Secondary | ICD-10-CM | POA: Diagnosis not present

## 2020-02-14 DIAGNOSIS — Z471 Aftercare following joint replacement surgery: Secondary | ICD-10-CM | POA: Diagnosis not present

## 2020-02-14 DIAGNOSIS — I10 Essential (primary) hypertension: Secondary | ICD-10-CM | POA: Diagnosis not present

## 2020-02-14 DIAGNOSIS — E785 Hyperlipidemia, unspecified: Secondary | ICD-10-CM | POA: Diagnosis not present

## 2020-02-14 NOTE — Telephone Encounter (Signed)
7 day Ortho bundle call completed. 

## 2020-02-16 DIAGNOSIS — Z471 Aftercare following joint replacement surgery: Secondary | ICD-10-CM | POA: Diagnosis not present

## 2020-02-16 DIAGNOSIS — I10 Essential (primary) hypertension: Secondary | ICD-10-CM | POA: Diagnosis not present

## 2020-02-16 DIAGNOSIS — Z96653 Presence of artificial knee joint, bilateral: Secondary | ICD-10-CM | POA: Diagnosis not present

## 2020-02-16 DIAGNOSIS — K219 Gastro-esophageal reflux disease without esophagitis: Secondary | ICD-10-CM | POA: Diagnosis not present

## 2020-02-16 DIAGNOSIS — E785 Hyperlipidemia, unspecified: Secondary | ICD-10-CM | POA: Diagnosis not present

## 2020-02-17 NOTE — Discharge Summary (Signed)
Physician Discharge Summary      Patient ID: Billy Fields MRN: [id number] DOB/AGE: March 26, 1951 69 y.o.  Admit date: 02/07/2020 Discharge date: 02/08/2020  Admission Diagnoses:  Active Problems:   S/P total knee arthroplasty, right   Discharge Diagnoses:  Same  Surgeries: Procedure(s): RIGHT TOTAL KNEE ARTHROPLASTY on 02/07/2020   Consultants:   Discharged Condition: Stable  Hospital Course: OSHER OETTINGER is an 69 y.o. male who was admitted 02/07/2020 with a chief complaint of right knee pain, and found to have a diagnosis of right knee osteoarthritis.  They were brought to the operating room on 02/07/2020 and underwent the above named procedures.  Pt awoke from anesthesia without complication and was transferred to the floor. On POD1, patient's pain was well controlled.  He was able to ambulate well.  Did well in physical therapy.  He was discharged home on postop day 1..  Pt will f/u with Dr. Marlou Sa in clinic in ~2 weeks.   Antibiotics given:  Anti-infectives (From admission, onward)   Start     Dose/Rate Route Frequency Ordered Stop   02/07/20 1330  ceFAZolin (ANCEF) IVPB 2g/100 mL premix        2 g 200 mL/hr over 30 Minutes Intravenous Every 6 hours 02/07/20 1131 02/08/20 0700   02/07/20 0600  ceFAZolin (ANCEF) IVPB 2g/100 mL premix        2 g 200 mL/hr over 30 Minutes Intravenous On call to O.R. 02/07/20 0532 02/07/20 0732    .  Recent vital signs:  Vitals:   02/08/20 0647 02/08/20 0912  BP: 114/78 107/71  Pulse: 63 64  Resp: 18 16  Temp: 98.3 F (36.8 C) 98.5 F (36.9 C)  SpO2: 97% 98%    Recent laboratory studies:  Results for orders placed or performed during the hospital encounter of 02/03/20  SARS CORONAVIRUS 2 (TAT 6-24 HRS) Nasopharyngeal Nasopharyngeal Swab   Specimen: Nasopharyngeal Swab  Result Value Ref Range   SARS Coronavirus 2 NEGATIVE NEGATIVE    Discharge Medications:   Allergies as of 02/08/2020   No Known Allergies     Medication List     STOP taking these medications   aspirin EC 81 MG tablet     TAKE these medications   atorvastatin 10 MG tablet Commonly known as: LIPITOR Take 10 mg by mouth daily at 6 PM.   losartan 100 MG tablet Commonly known as: COZAAR Take 100 mg by mouth daily.   methocarbamol 500 MG tablet Commonly known as: ROBAXIN Take 1 tablet (500 mg total) by mouth every 8 (eight) hours as needed for muscle spasms.   multivitamin with minerals Tabs tablet Take 1 tablet by mouth daily.   oxyCODONE 5 MG immediate release tablet Commonly known as: Oxy IR/ROXICODONE Take 1 tablet (5 mg total) by mouth every 4 (four) hours as needed for moderate pain (pain score 4-6).   terazosin 2 MG capsule Commonly known as: HYTRIN Take 2 mg by mouth at bedtime.       Diagnostic Studies: XR KNEE 3 VIEW RIGHT  Result Date: 01/20/2020 AP lateral merchant right knee reviewed.  End-stage tricompartmental arthritis is present.  Alignment intact slight valgus.  No acute fracture.  Left total knee replacement in good position with no complicating features.  Bone quality appears good.   Disposition: Discharge disposition: 01-Home or Self Care       Discharge Instructions    Call MD / Call 911   Complete by: As directed    If  you experience chest pain or shortness of breath, CALL 911 and be transported to the hospital emergency room.  If you develope a fever above 101 F, pus (white drainage) or increased drainage or redness at the wound, or calf pain, call your surgeon's office.   Constipation Prevention   Complete by: As directed    Drink plenty of fluids.  Prune juice may be helpful.  You may use a stool softener, such as Colace (over the counter) 100 mg twice a day.  Use MiraLax (over the counter) for constipation as needed.   Diet - low sodium heart healthy   Complete by: As directed    Discharge instructions   Complete by: As directed    You may shower, dressing is waterproof.  Do not remove the  dressing, we will remove it at your first post-op appointment.  Do not take a bath or soak the knee in a tub or pool.  You may weightbear as you can tolerate on the operative leg with a walker.  Continue using the CPM machine 3 times per day for one hour each time, increasing the degrees of range of motion daily.  Use the blue cradle boot under your heel to work on getting your leg straight.  Do NOT put a pillow under your knee.  You will follow-up with Dr. Marlou Sa or Lurena Joiner in the clinic in 1-2 weeks at your given appointment date.  Call the office with any questions or concerns.   Increase activity slowly as tolerated   Complete by: As directed        Follow-up Information    Marlou Sa, Tonna Corner, MD. Go on 02/18/2020.   Specialty: Orthopedic Surgery Why: at 1:15 pm in our office for your 2 week post-op appointment. Contact information: [place] [address] [phone]        Home, [facility] Follow up.   Specialty: [place] Why: Someone from the home health agency will be in contact wity you after discharge to arrange your first in home physical therapy appointment Contact information: [place] [address] [phone]        Medicine, [facility] Follow up.   Why: Case manager will set up your first Outpatient Physical Therapy appointment around 2 weeks after surgery. Appointment is pending at this time. Contact information: [place] [address] [phone]                Signed: Donella Stade 02/17/2020, 11:12 AM

## 2020-02-18 ENCOUNTER — Ambulatory Visit (INDEPENDENT_AMBULATORY_CARE_PROVIDER_SITE_OTHER): Payer: Medicare Other | Admitting: Surgical

## 2020-02-18 ENCOUNTER — Telehealth: Payer: Self-pay | Admitting: *Deleted

## 2020-02-18 ENCOUNTER — Ambulatory Visit (INDEPENDENT_AMBULATORY_CARE_PROVIDER_SITE_OTHER): Payer: Medicare Other

## 2020-02-18 ENCOUNTER — Encounter: Payer: Self-pay | Admitting: Surgical

## 2020-02-18 DIAGNOSIS — Z96651 Presence of right artificial knee joint: Secondary | ICD-10-CM | POA: Diagnosis not present

## 2020-02-18 NOTE — Progress Notes (Signed)
   Post-Op Visit Note   Patient: Billy Fields           Date of Birth: [birth year]           MRN: [id number] Visit Date: 02/18/2020 PCP: [facility] [place]   Assessment & Plan:  Chief Complaint:  Chief Complaint  Patient presents with  . Right Knee - Routine Post Op   Visit Diagnoses:  1. S/P total knee arthroplasty, right     Plan: Patient is a 69 year old male who presents s/p right total knee arthroplasty on 02/07/2020.  He is doing well.  He is ambulating with a cane.  He is on the CPM machine and up to 60 degrees.  He completed his home health physical therapy course.  He denies any fevers, chills, night sweats, drainage.  On exam incision is healing well with no evidence of infection or dehiscence.  No calf tenderness and a negative Bevelyn Buckles' sign today.  He extends to about 5 degrees of extension and flexes to 75 degrees.  He notes that he does not have to take oxycodone for pain control and is only taking Tylenol.  He is set up to see [facility] physical therapy for outpatient therapy initial appointment on Monday.  Radiographs of the right knee reveal a right knee prosthesis in good position and alignment without any complicating features.  Overall patient is doing well and there are no major concerns today.  Plan for him to transition from aspirin twice a day to his normal aspirin once a day.  Follow-up in 4 weeks for clinical recheck.  Patient agreed with plan.  Follow-Up Instructions: Return in about 4 weeks (around 03/17/2020).   Orders:  Orders Placed This Encounter  Procedures  . XR Knee 1-2 Views Right   No orders of the defined types were placed in this encounter.   Imaging: No results found.  PMFS History: Patient Active Problem List   Diagnosis Date Noted  . S/P total knee arthroplasty, right 02/07/2020  . Degenerative arthritis of knee 07/05/2014   Past Medical History:  Diagnosis Date  . Arthritis   . GERD (gastroesophageal reflux  disease)   . Hyperlipidemia   . Hypertension     Family History  Problem Relation Age of Onset  . Heart attack Mother   . Cirrhosis Father   . CAD Father     Past Surgical History:  Procedure Laterality Date  . COLONOSCOPY    . KNEE ARTHROSCOPY Left   . TOE SURGERY Right   . TONSILLECTOMY    . TOTAL KNEE ARTHROPLASTY Left 07/05/2014   Procedure: TOTAL KNEE ARTHROPLASTY;  Surgeon: Meredith Pel, MD;  Location: [place];  Service: Orthopedics;  Laterality: Left;  . TOTAL KNEE ARTHROPLASTY Right 02/07/2020   Procedure: RIGHT TOTAL KNEE ARTHROPLASTY;  Surgeon: Meredith Pel, MD;  Location: [facility];  Service: Orthopedics;  Laterality: Right;  Marland Kitchen VASECTOMY    . VASECTOMY REVERSAL     Social History   Occupational History  . Not on file  Tobacco Use  . Smoking status: Former Smoker    Quit date: 03/27/1998    Years since quitting: 21.9  . Smokeless tobacco: Never Used  Vaping Use  . Vaping Use: Never used  Substance and Sexual Activity  . Alcohol use: No  . Drug use: No  . Sexual activity: Yes

## 2020-02-18 NOTE — Telephone Encounter (Signed)
14 day Ortho bundle in office visit completed. 

## 2020-02-21 DIAGNOSIS — M25561 Pain in right knee: Secondary | ICD-10-CM | POA: Diagnosis not present

## 2020-02-24 DIAGNOSIS — M25561 Pain in right knee: Secondary | ICD-10-CM | POA: Diagnosis not present

## 2020-03-01 DIAGNOSIS — M25561 Pain in right knee: Secondary | ICD-10-CM | POA: Diagnosis not present

## 2020-03-06 DIAGNOSIS — Z1211 Encounter for screening for malignant neoplasm of colon: Secondary | ICD-10-CM | POA: Diagnosis not present

## 2020-03-06 DIAGNOSIS — R739 Hyperglycemia, unspecified: Secondary | ICD-10-CM | POA: Diagnosis not present

## 2020-03-06 DIAGNOSIS — I10 Essential (primary) hypertension: Secondary | ICD-10-CM | POA: Diagnosis not present

## 2020-03-06 DIAGNOSIS — N1831 Chronic kidney disease, stage 3a: Secondary | ICD-10-CM | POA: Diagnosis not present

## 2020-03-06 DIAGNOSIS — E782 Mixed hyperlipidemia: Secondary | ICD-10-CM | POA: Diagnosis not present

## 2020-03-06 DIAGNOSIS — M25561 Pain in right knee: Secondary | ICD-10-CM | POA: Diagnosis not present

## 2020-03-06 DIAGNOSIS — I129 Hypertensive chronic kidney disease with stage 1 through stage 4 chronic kidney disease, or unspecified chronic kidney disease: Secondary | ICD-10-CM | POA: Diagnosis not present

## 2020-03-09 DIAGNOSIS — M25561 Pain in right knee: Secondary | ICD-10-CM | POA: Diagnosis not present

## 2020-03-10 ENCOUNTER — Telehealth: Payer: Self-pay | Admitting: *Deleted

## 2020-03-10 DIAGNOSIS — M1711 Unilateral primary osteoarthritis, right knee: Secondary | ICD-10-CM | POA: Diagnosis not present

## 2020-03-10 NOTE — Telephone Encounter (Signed)
Ortho bundle 30 day call completed. °

## 2020-03-14 DIAGNOSIS — M25561 Pain in right knee: Secondary | ICD-10-CM | POA: Diagnosis not present

## 2020-03-16 DIAGNOSIS — M25561 Pain in right knee: Secondary | ICD-10-CM | POA: Diagnosis not present

## 2020-03-17 ENCOUNTER — Encounter: Payer: Self-pay | Admitting: Orthopedic Surgery

## 2020-03-17 ENCOUNTER — Ambulatory Visit (INDEPENDENT_AMBULATORY_CARE_PROVIDER_SITE_OTHER): Payer: Medicare Other | Admitting: Orthopedic Surgery

## 2020-03-17 ENCOUNTER — Telehealth: Payer: Self-pay | Admitting: Orthopedic Surgery

## 2020-03-17 VITALS — Ht 72.0 in | Wt 216.0 lb

## 2020-03-17 DIAGNOSIS — Z23 Encounter for immunization: Secondary | ICD-10-CM | POA: Diagnosis not present

## 2020-03-17 DIAGNOSIS — Z96651 Presence of right artificial knee joint: Secondary | ICD-10-CM

## 2020-03-17 NOTE — Progress Notes (Signed)
   Post-Op Visit Note   Patient: Billy Fields           Date of Birth: Jun 04, 1951           MRN: [id number] Visit Date: 03/17/2020 PCP: Vernie Shanks, MD   Assessment & Plan:  Chief Complaint:  Chief Complaint  Patient presents with  . Right Knee - Follow-up    02/07/2020 Right TKA   Visit Diagnoses:  1. S/P total knee arthroplasty, right     Plan: Nathaneil the 69-year-old patient is now about a month out right total knee replacement.  Range of motion 7 to 90 degrees.  In physical therapy doing home exercise program.  I want him to really focus on achieving full extension and more flexion over the next 4 weeks.  Add very low-dose of ibuprofen 400 mg a day for the next 4 weeks.  I think that will help with inflammation and help him to get more motion in the knee.  He wants to be able to climb ladders into his boat.  He will need more knee flexion for that.  He is got about an inch height difference patella wise with both knees extended.  Follow-up in 4 weeks.  No calf tenderness negative Homans today.  Follow-Up Instructions: Return in about 4 weeks (around 04/14/2020).   Orders:  No orders of the defined types were placed in this encounter.  No orders of the defined types were placed in this encounter.   Imaging: No results found.  PMFS History: Patient Active Problem List   Diagnosis Date Noted  . S/P total knee arthroplasty, right 02/07/2020  . Degenerative arthritis of knee 07/05/2014   Past Medical History:  Diagnosis Date  . Arthritis   . GERD (gastroesophageal reflux disease)   . Hyperlipidemia   . Hypertension     Family History  Problem Relation Age of Onset  . Heart attack Mother   . Cirrhosis Father   . CAD Father     Past Surgical History:  Procedure Laterality Date  . COLONOSCOPY    . KNEE ARTHROSCOPY Left   . TOE SURGERY Right   . TONSILLECTOMY    . TOTAL KNEE ARTHROPLASTY Left 07/05/2014   Procedure: TOTAL KNEE ARTHROPLASTY;  Surgeon: Meredith Pel, MD;  Location: [facility];  Service: Orthopedics;  Laterality: Left;  . TOTAL KNEE ARTHROPLASTY Right 02/07/2020   Procedure: RIGHT TOTAL KNEE ARTHROPLASTY;  Surgeon: Meredith Pel, MD;  [facility];  Service: Orthopedics;  Laterality: Right;  Marland Kitchen VASECTOMY    . VASECTOMY REVERSAL     Social History   Occupational History  . Not on file  Tobacco Use  . Smoking status: Former Smoker    Quit date: 03/27/1998    Years since quitting: 21.9  . Smokeless tobacco: Never Used  Vaping Use  . Vaping Use: Never used  Substance and Sexual Activity  . Alcohol use: No  . Drug use: No  . Sexual activity: Yes

## 2020-03-17 NOTE — Telephone Encounter (Signed)
LMOM for patient letting him know we were returning his call and to call back if he has any questions

## 2020-03-17 NOTE — Telephone Encounter (Signed)
Patient called requesting a call back. Please call patient. He has an additional question. Please call patient [phone].

## 2020-03-20 DIAGNOSIS — M25561 Pain in right knee: Secondary | ICD-10-CM | POA: Diagnosis not present

## 2020-03-22 DIAGNOSIS — M25561 Pain in right knee: Secondary | ICD-10-CM | POA: Diagnosis not present

## 2020-04-04 DIAGNOSIS — M25561 Pain in right knee: Secondary | ICD-10-CM | POA: Diagnosis not present

## 2020-04-06 DIAGNOSIS — M25561 Pain in right knee: Secondary | ICD-10-CM | POA: Diagnosis not present

## 2020-04-10 DIAGNOSIS — M25561 Pain in right knee: Secondary | ICD-10-CM | POA: Diagnosis not present

## 2020-04-12 DIAGNOSIS — M25561 Pain in right knee: Secondary | ICD-10-CM | POA: Diagnosis not present

## 2020-04-14 ENCOUNTER — Other Ambulatory Visit: Payer: Self-pay

## 2020-04-14 ENCOUNTER — Ambulatory Visit (INDEPENDENT_AMBULATORY_CARE_PROVIDER_SITE_OTHER): Payer: Medicare Other | Admitting: Orthopedic Surgery

## 2020-04-14 DIAGNOSIS — Z96651 Presence of right artificial knee joint: Secondary | ICD-10-CM

## 2020-04-14 NOTE — Progress Notes (Signed)
   Post-Op Visit Note   Patient: Billy Fields           Date of Birth: [birth year]           MRN: [id number] Visit Date: 04/14/2020 PCP: Vernie Shanks, MD   Assessment & Plan:  Chief Complaint:  Chief Complaint  Patient presents with  . Right Knee - Follow-up    TKA:02/07/20   Visit Diagnoses:  1. S/P total knee arthroplasty, right     Plan: Patient is a 69 year old male presents s/p right total knee arthroplasty on 02/07/2020.  He states that he is doing well and has no significant problems in his daily life.  He is going to physical therapy at [facility] physical therapy 2 times per week.  They are working primarily on range of motion and strengthening.  He has excellent quad strength on exam today and his incision has healed up without any evidence of infection or dehiscence.  He has extension to about 5 degrees and excellent quad control, able to perform multiple straight leg raises without difficulty.  He has been working on extension, and has improved from 9 degrees at last visit to about 5 degrees at this visit.  He has a soft endpoint with extension.  Flexion comes to about 105 degrees.  No calf tenderness, negative Homans' sign.  Not walking with any cane or walker.  Plan for patient to continue with physical therapy and home exercise program and follow-up in 6 weeks for final check.  He has no upcoming dental appointments.  Follow-Up Instructions: No follow-ups on file.   Orders:  No orders of the defined types were placed in this encounter.  No orders of the defined types were placed in this encounter.   Imaging: No results found.  PMFS History: Patient Active Problem List   Diagnosis Date Noted  . S/P total knee arthroplasty, right 02/07/2020  . Degenerative arthritis of knee 07/05/2014   Past Medical History:  Diagnosis Date  . Arthritis   . GERD (gastroesophageal reflux disease)   . Hyperlipidemia   . Hypertension     Family History  Problem Relation  Age of Onset  . Heart attack Mother   . Cirrhosis Father   . CAD Father     Past Surgical History:  Procedure Laterality Date  . COLONOSCOPY    . KNEE ARTHROSCOPY Left   . TOE SURGERY Right   . TONSILLECTOMY    . TOTAL KNEE ARTHROPLASTY Left 07/05/2014   Procedure: TOTAL KNEE ARTHROPLASTY;  Surgeon: Meredith Pel, MD;  Location: [facility];  Service: Orthopedics;  Laterality: Left;  . TOTAL KNEE ARTHROPLASTY Right 02/07/2020   Procedure: RIGHT TOTAL KNEE ARTHROPLASTY;  Surgeon: Meredith Pel, MD;  [facility];  Service: Orthopedics;  Laterality: Right;  Marland Kitchen VASECTOMY    . VASECTOMY REVERSAL     Social History   Occupational History  . Not on file  Tobacco Use  . Smoking status: Former Smoker    Quit date: 03/27/1998    Years since quitting: 22.0  . Smokeless tobacco: Never Used  Vaping Use  . Vaping Use: Never used  Substance and Sexual Activity  . Alcohol use: No  . Drug use: No  . Sexual activity: Yes

## 2020-04-16 ENCOUNTER — Encounter: Payer: Self-pay | Admitting: Orthopedic Surgery

## 2020-04-17 DIAGNOSIS — M25561 Pain in right knee: Secondary | ICD-10-CM | POA: Diagnosis not present

## 2020-04-19 DIAGNOSIS — M25561 Pain in right knee: Secondary | ICD-10-CM | POA: Diagnosis not present

## 2020-04-26 DIAGNOSIS — M25561 Pain in right knee: Secondary | ICD-10-CM | POA: Diagnosis not present

## 2020-04-28 DIAGNOSIS — M25561 Pain in right knee: Secondary | ICD-10-CM | POA: Diagnosis not present

## 2020-05-01 DIAGNOSIS — M25561 Pain in right knee: Secondary | ICD-10-CM | POA: Diagnosis not present

## 2020-05-10 DIAGNOSIS — M25561 Pain in right knee: Secondary | ICD-10-CM | POA: Diagnosis not present

## 2020-05-10 DIAGNOSIS — Z23 Encounter for immunization: Secondary | ICD-10-CM | POA: Diagnosis not present

## 2020-05-12 ENCOUNTER — Telehealth: Payer: Self-pay | Admitting: *Deleted

## 2020-05-12 NOTE — Telephone Encounter (Signed)
90 day Ortho bundle call completed. 

## 2020-05-15 DIAGNOSIS — M25561 Pain in right knee: Secondary | ICD-10-CM | POA: Diagnosis not present

## 2020-05-26 ENCOUNTER — Other Ambulatory Visit: Payer: Self-pay

## 2020-05-26 ENCOUNTER — Ambulatory Visit (INDEPENDENT_AMBULATORY_CARE_PROVIDER_SITE_OTHER): Payer: Medicare Other | Admitting: Orthopedic Surgery

## 2020-05-26 DIAGNOSIS — Z96651 Presence of right artificial knee joint: Secondary | ICD-10-CM

## 2020-05-28 ENCOUNTER — Encounter: Payer: Self-pay | Admitting: Orthopedic Surgery

## 2020-05-28 NOTE — Progress Notes (Signed)
   Post-Op Visit Note   Patient: Billy Fields           Date of Birth: 1951/03/26           MRN: [id number] Visit Date: 05/26/2020 PCP: Vernie Shanks, MD   Assessment & Plan:  Chief Complaint:  Chief Complaint  Patient presents with  . Right Knee - Routine Post Op   Visit Diagnoses:  1. S/P total knee arthroplasty, right     Plan: Patient is a 69 year old male who presents s/p right total knee arthroplasty on 02/07/2020.  He notes he is doing well overall.  He was having some catching symptoms but these have completely resolved at this point.  He is done with physical therapy and he is now going to the gym where he works on using the stationary bike and walking.  He also plays golf.  He denies any difficulty with playing golf.  He was able to walk 9000 steps yesterday along with a round of golf and states he had no significant increased discomfort in the knee.  Incision is healed well with no evidence of infection.  No effusion today.  Negative calf tenderness.  Negative Homans' sign.  Range of motion with 3 degrees of extension and 105 degrees of flexion of the right knee.  Plan for patient to follow-up as needed.  Follow-Up Instructions: No follow-ups on file.   Orders:  No orders of the defined types were placed in this encounter.  No orders of the defined types were placed in this encounter.   Imaging: No results found.  PMFS History: Patient Active Problem List   Diagnosis Date Noted  . S/P total knee arthroplasty, right 02/07/2020  . Degenerative arthritis of knee 07/05/2014   Past Medical History:  Diagnosis Date  . Arthritis   . GERD (gastroesophageal reflux disease)   . Hyperlipidemia   . Hypertension     Family History  Problem Relation Age of Onset  . Heart attack Mother   . Cirrhosis Father   . CAD Father     Past Surgical History:  Procedure Laterality Date  . COLONOSCOPY    . KNEE ARTHROSCOPY Left   . TOE SURGERY Right   . TONSILLECTOMY    .  TOTAL KNEE ARTHROPLASTY Left 07/05/2014   Procedure: TOTAL KNEE ARTHROPLASTY;  Surgeon: Meredith Pel, MD;  Location: [facility];  Service: Orthopedics;  Laterality: Left;  . TOTAL KNEE ARTHROPLASTY Right 02/07/2020   Procedure: RIGHT TOTAL KNEE ARTHROPLASTY;  Surgeon: Meredith Pel, MD;  [facility];  Service: Orthopedics;  Laterality: Right;  Marland Kitchen VASECTOMY    . VASECTOMY REVERSAL     Social History   Occupational History  . Not on file  Tobacco Use  . Smoking status: Former Smoker    Quit date: 03/27/1998    Years since quitting: 22.1  . Smokeless tobacco: Never Used  Vaping Use  . Vaping Use: Never used  Substance and Sexual Activity  . Alcohol use: No  . Drug use: No  . Sexual activity: Yes

## 2020-05-29 ENCOUNTER — Encounter: Payer: Self-pay | Admitting: Orthopedic Surgery

## 2020-06-05 ENCOUNTER — Telehealth: Payer: Self-pay | Admitting: Orthopedic Surgery

## 2020-06-05 MED ORDER — AMOXICILLIN 500 MG PO TABS
ORAL_TABLET | ORAL | 0 refills | Status: DC
Start: 2020-06-05 — End: 2021-04-06

## 2020-06-05 NOTE — Telephone Encounter (Signed)
Pt is having dental work done and is requesting antibiotics.  Dental work is around the 1st of jan.  Surgery was aug the 16th of 2021

## 2020-06-06 NOTE — Telephone Encounter (Signed)
IC LMVM advising submitted

## 2020-07-05 DIAGNOSIS — H353131 Nonexudative age-related macular degeneration, bilateral, early dry stage: Secondary | ICD-10-CM | POA: Diagnosis not present

## 2020-07-05 DIAGNOSIS — H52203 Unspecified astigmatism, bilateral: Secondary | ICD-10-CM | POA: Diagnosis not present

## 2020-07-05 DIAGNOSIS — H532 Diplopia: Secondary | ICD-10-CM | POA: Diagnosis not present

## 2020-07-05 DIAGNOSIS — H35373 Puckering of macula, bilateral: Secondary | ICD-10-CM | POA: Diagnosis not present

## 2020-07-12 DIAGNOSIS — Z85820 Personal history of malignant melanoma of skin: Secondary | ICD-10-CM | POA: Diagnosis not present

## 2020-07-12 DIAGNOSIS — D2272 Melanocytic nevi of left lower limb, including hip: Secondary | ICD-10-CM | POA: Diagnosis not present

## 2020-07-12 DIAGNOSIS — C44519 Basal cell carcinoma of skin of other part of trunk: Secondary | ICD-10-CM | POA: Diagnosis not present

## 2020-07-12 DIAGNOSIS — L821 Other seborrheic keratosis: Secondary | ICD-10-CM | POA: Diagnosis not present

## 2020-07-12 DIAGNOSIS — D485 Neoplasm of uncertain behavior of skin: Secondary | ICD-10-CM | POA: Diagnosis not present

## 2020-07-12 DIAGNOSIS — D2271 Melanocytic nevi of right lower limb, including hip: Secondary | ICD-10-CM | POA: Diagnosis not present

## 2020-07-12 DIAGNOSIS — L570 Actinic keratosis: Secondary | ICD-10-CM | POA: Diagnosis not present

## 2020-07-12 DIAGNOSIS — D1801 Hemangioma of skin and subcutaneous tissue: Secondary | ICD-10-CM | POA: Diagnosis not present

## 2020-07-12 DIAGNOSIS — Z85828 Personal history of other malignant neoplasm of skin: Secondary | ICD-10-CM | POA: Diagnosis not present

## 2020-07-12 DIAGNOSIS — D225 Melanocytic nevi of trunk: Secondary | ICD-10-CM | POA: Diagnosis not present

## 2020-07-24 DIAGNOSIS — Z01812 Encounter for preprocedural laboratory examination: Secondary | ICD-10-CM | POA: Diagnosis not present

## 2020-07-27 DIAGNOSIS — K5730 Diverticulosis of large intestine without perforation or abscess without bleeding: Secondary | ICD-10-CM | POA: Diagnosis not present

## 2020-07-27 DIAGNOSIS — D128 Benign neoplasm of rectum: Secondary | ICD-10-CM | POA: Diagnosis not present

## 2020-07-27 DIAGNOSIS — Z1211 Encounter for screening for malignant neoplasm of colon: Secondary | ICD-10-CM | POA: Diagnosis not present

## 2020-07-31 DIAGNOSIS — D128 Benign neoplasm of rectum: Secondary | ICD-10-CM | POA: Diagnosis not present

## 2020-09-04 DIAGNOSIS — Z125 Encounter for screening for malignant neoplasm of prostate: Secondary | ICD-10-CM | POA: Diagnosis not present

## 2020-09-04 DIAGNOSIS — N1831 Chronic kidney disease, stage 3a: Secondary | ICD-10-CM | POA: Diagnosis not present

## 2020-09-04 DIAGNOSIS — I10 Essential (primary) hypertension: Secondary | ICD-10-CM | POA: Diagnosis not present

## 2020-09-04 DIAGNOSIS — E782 Mixed hyperlipidemia: Secondary | ICD-10-CM | POA: Diagnosis not present

## 2020-09-04 DIAGNOSIS — I129 Hypertensive chronic kidney disease with stage 1 through stage 4 chronic kidney disease, or unspecified chronic kidney disease: Secondary | ICD-10-CM | POA: Diagnosis not present

## 2020-09-04 DIAGNOSIS — R739 Hyperglycemia, unspecified: Secondary | ICD-10-CM | POA: Diagnosis not present

## 2020-09-06 DIAGNOSIS — N1831 Chronic kidney disease, stage 3a: Secondary | ICD-10-CM | POA: Diagnosis not present

## 2020-09-06 DIAGNOSIS — R739 Hyperglycemia, unspecified: Secondary | ICD-10-CM | POA: Diagnosis not present

## 2020-09-06 DIAGNOSIS — I10 Essential (primary) hypertension: Secondary | ICD-10-CM | POA: Diagnosis not present

## 2020-09-06 DIAGNOSIS — Z125 Encounter for screening for malignant neoplasm of prostate: Secondary | ICD-10-CM | POA: Diagnosis not present

## 2020-09-06 DIAGNOSIS — E782 Mixed hyperlipidemia: Secondary | ICD-10-CM | POA: Diagnosis not present

## 2020-09-06 DIAGNOSIS — I129 Hypertensive chronic kidney disease with stage 1 through stage 4 chronic kidney disease, or unspecified chronic kidney disease: Secondary | ICD-10-CM | POA: Diagnosis not present

## 2020-10-23 DIAGNOSIS — I10 Essential (primary) hypertension: Secondary | ICD-10-CM | POA: Diagnosis not present

## 2020-10-23 DIAGNOSIS — M19041 Primary osteoarthritis, right hand: Secondary | ICD-10-CM | POA: Diagnosis not present

## 2020-10-23 DIAGNOSIS — R739 Hyperglycemia, unspecified: Secondary | ICD-10-CM | POA: Diagnosis not present

## 2020-10-23 DIAGNOSIS — E782 Mixed hyperlipidemia: Secondary | ICD-10-CM | POA: Diagnosis not present

## 2020-10-23 DIAGNOSIS — I129 Hypertensive chronic kidney disease with stage 1 through stage 4 chronic kidney disease, or unspecified chronic kidney disease: Secondary | ICD-10-CM | POA: Diagnosis not present

## 2020-10-23 DIAGNOSIS — N1831 Chronic kidney disease, stage 3a: Secondary | ICD-10-CM | POA: Diagnosis not present

## 2020-10-25 ENCOUNTER — Other Ambulatory Visit (HOSPITAL_BASED_OUTPATIENT_CLINIC_OR_DEPARTMENT_OTHER): Payer: Self-pay

## 2020-10-25 ENCOUNTER — Ambulatory Visit: Payer: Medicare Other | Attending: Internal Medicine

## 2020-10-25 ENCOUNTER — Other Ambulatory Visit: Payer: Self-pay

## 2020-10-25 DIAGNOSIS — Z23 Encounter for immunization: Secondary | ICD-10-CM

## 2020-10-25 MED ORDER — COVID-19 MRNA VACC (MODERNA) 100 MCG/0.5ML IM SUSP
INTRAMUSCULAR | 0 refills | Status: DC
  Filled 2020-10-25: qty 0.25, 1d supply, fill #0

## 2020-10-25 NOTE — Progress Notes (Signed)
   Covid-19 Vaccination Clinic  Name:  Billy Fields    MRN: [id number] DOB: [birth year]  10/25/2020  [name] was observed post Covid-19 immunization for 15 minutes without incident. He was provided with Vaccine Information Sheet and instruction to access the V-Safe system.   [name] was instructed to call 911 with any severe reactions post vaccine: Marland Kitchen Difficulty breathing  . Swelling of face and throat  . A fast heartbeat  . A bad rash all over body  . Dizziness and weakness   Immunizations Administered    Name Date Dose VIS Date Route   Moderna Covid-19 Booster Vaccine 10/25/2020  3:07 PM 0.25 mL 04/12/2020 Intramuscular   Manufacturer: Moderna   Lot: [id number]   [place]: [id number]

## 2021-01-01 DIAGNOSIS — J40 Bronchitis, not specified as acute or chronic: Secondary | ICD-10-CM | POA: Diagnosis not present

## 2021-01-01 DIAGNOSIS — R059 Cough, unspecified: Secondary | ICD-10-CM | POA: Diagnosis not present

## 2021-01-15 DIAGNOSIS — M79641 Pain in right hand: Secondary | ICD-10-CM | POA: Diagnosis not present

## 2021-01-15 DIAGNOSIS — M13849 Other specified arthritis, unspecified hand: Secondary | ICD-10-CM | POA: Diagnosis not present

## 2021-01-15 DIAGNOSIS — L570 Actinic keratosis: Secondary | ICD-10-CM | POA: Diagnosis not present

## 2021-01-15 DIAGNOSIS — L812 Freckles: Secondary | ICD-10-CM | POA: Diagnosis not present

## 2021-01-15 DIAGNOSIS — Z85828 Personal history of other malignant neoplasm of skin: Secondary | ICD-10-CM | POA: Diagnosis not present

## 2021-01-15 DIAGNOSIS — L821 Other seborrheic keratosis: Secondary | ICD-10-CM | POA: Diagnosis not present

## 2021-01-15 DIAGNOSIS — D1801 Hemangioma of skin and subcutaneous tissue: Secondary | ICD-10-CM | POA: Diagnosis not present

## 2021-01-15 DIAGNOSIS — M13841 Other specified arthritis, right hand: Secondary | ICD-10-CM | POA: Diagnosis not present

## 2021-03-05 DIAGNOSIS — R739 Hyperglycemia, unspecified: Secondary | ICD-10-CM | POA: Diagnosis not present

## 2021-03-05 DIAGNOSIS — E782 Mixed hyperlipidemia: Secondary | ICD-10-CM | POA: Diagnosis not present

## 2021-03-29 DIAGNOSIS — N1831 Chronic kidney disease, stage 3a: Secondary | ICD-10-CM | POA: Diagnosis not present

## 2021-03-29 DIAGNOSIS — Z23 Encounter for immunization: Secondary | ICD-10-CM | POA: Diagnosis not present

## 2021-03-29 DIAGNOSIS — I129 Hypertensive chronic kidney disease with stage 1 through stage 4 chronic kidney disease, or unspecified chronic kidney disease: Secondary | ICD-10-CM | POA: Diagnosis not present

## 2021-03-29 DIAGNOSIS — M19041 Primary osteoarthritis, right hand: Secondary | ICD-10-CM | POA: Diagnosis not present

## 2021-03-29 DIAGNOSIS — I10 Essential (primary) hypertension: Secondary | ICD-10-CM | POA: Diagnosis not present

## 2021-03-29 DIAGNOSIS — E782 Mixed hyperlipidemia: Secondary | ICD-10-CM | POA: Diagnosis not present

## 2021-03-29 DIAGNOSIS — R739 Hyperglycemia, unspecified: Secondary | ICD-10-CM | POA: Diagnosis not present

## 2021-03-29 DIAGNOSIS — Z125 Encounter for screening for malignant neoplasm of prostate: Secondary | ICD-10-CM | POA: Diagnosis not present

## 2021-04-06 ENCOUNTER — Telehealth: Payer: Self-pay | Admitting: *Deleted

## 2021-04-06 ENCOUNTER — Other Ambulatory Visit: Payer: Self-pay | Admitting: Surgical

## 2021-04-06 MED ORDER — AMOXICILLIN 500 MG PO TABS: ORAL_TABLET | ORAL | 0 refills | Status: DC

## 2021-04-06 NOTE — Telephone Encounter (Signed)
1 year call completed to patient; he requested refill of antibiotic for dental prophylaxis. Pharmacy- [facility] on [address].Thanks.

## 2021-04-06 NOTE — Telephone Encounter (Signed)
Sent in

## 2021-06-04 ENCOUNTER — Ambulatory Visit (INDEPENDENT_AMBULATORY_CARE_PROVIDER_SITE_OTHER): Payer: Medicare Other

## 2021-06-04 ENCOUNTER — Ambulatory Visit (INDEPENDENT_AMBULATORY_CARE_PROVIDER_SITE_OTHER): Payer: Medicare Other | Admitting: Orthopedic Surgery

## 2021-06-04 ENCOUNTER — Other Ambulatory Visit: Payer: Self-pay

## 2021-06-04 DIAGNOSIS — M25512 Pain in left shoulder: Secondary | ICD-10-CM | POA: Diagnosis not present

## 2021-06-07 ENCOUNTER — Encounter: Payer: Self-pay | Admitting: Orthopedic Surgery

## 2021-06-07 NOTE — Progress Notes (Signed)
Office Visit Note   Patient: Billy Fields           Date of Birth: 11-28-1950           MRN: [id number] Visit Date: 06/04/2021 Requested by: Vernie Shanks, MD [place],  [place] [address] PCP: Vernie Shanks, MD  Subjective: Chief Complaint  Patient presents with   Left Shoulder - Pain    HPI: [name] is a 70 year old patient with left shoulder pain.  Denies any history of injury.  Ongoing for several months.  The pain will wake him from sleep occasionally.  He is right-hand dominant.  Pain localizes to the anterior part of the shoulder.  Denies any decreased strength but he states that some motions are painful.  He does report some grinding.  Denies neck pain or radicular symptoms.  He is okay playing golf.  However taking the club out of his bag is painful.  Takes ibuprofen for symptoms.              ROS: All systems reviewed are negative as they relate to the chief complaint within the history of present illness.  Patient denies  fevers or chills.   Assessment & Plan: Visit Diagnoses:  1. Left shoulder pain, unspecified chronicity     Plan: Impression is left shoulder pain of several months duration with failure of conservative treatment.  Physical exam findings are consistent with rotator cuff pathology.  I think determining the size of this rotator cuff tear would be helpful in guiding decision-making.  He has a slight amount of proximal migration of the humeral head so this may be a chronic problem.  He would consider intervention if the fix was relatively simple.  We will see him back after his study.  Follow-Up Instructions: No follow-ups on file.   Orders:  Orders Placed This Encounter  Procedures   XR Shoulder Left   No orders of the defined types were placed in this encounter.     Procedures: No procedures performed   Clinical Data: No additional findings.  Objective: Vital Signs: There were no vitals taken for this visit.  Physical  Exam:   Constitutional: Patient appears well-developed HEENT:  Head: Normocephalic Eyes:EOM are normal Neck: Normal range of motion Cardiovascular: Normal rate Pulmonary/chest: Effort normal Neurologic: Patient is alert Skin: Skin is warm Psychiatric: Patient has normal mood and affect   Ortho Exam: Ortho exam demonstrates bilateral shoulder passive range of motion of 50/90/120.  On the left-hand side he does have some coarse grinding and crepitus with active and passive range of motion of the shoulder at 90 degrees of abduction.  Slight weakness to external rotation on the left compared to the right.  No discrete AC joint tenderness.  Has 5 out of 5 subscap strength bilaterally and no Popeye deformity in the arm.  Specialty Comments:  No specialty comments available.  Imaging: No results found.   PMFS History: Patient Active Problem List   Diagnosis Date Noted   S/P total knee arthroplasty, right 02/07/2020   Degenerative arthritis of knee 07/05/2014   Past Medical History:  Diagnosis Date   Arthritis    GERD (gastroesophageal reflux disease)    Hyperlipidemia    Hypertension     Family History  Problem Relation Age of Onset   Heart attack Mother    Cirrhosis Father    CAD Father     Past Surgical History:  Procedure Laterality Date   COLONOSCOPY  KNEE ARTHROSCOPY Left    TOE SURGERY Right    TONSILLECTOMY     TOTAL KNEE ARTHROPLASTY Left 07/05/2014   Procedure: TOTAL KNEE ARTHROPLASTY;  Surgeon: Meredith Pel, MD;  Location: [facility];  Service: Orthopedics;  Laterality: Left;   TOTAL KNEE ARTHROPLASTY Right 02/07/2020   Procedure: RIGHT TOTAL KNEE ARTHROPLASTY;  Surgeon: Meredith Pel, MD;  [facility];  Service: Orthopedics;  Laterality: Right;   VASECTOMY     VASECTOMY REVERSAL     Social History   Occupational History   Not on file  Tobacco Use   Smoking status: Former    Types: Cigarettes    Quit date: 03/27/1998    Years since  quitting: 23.2   Smokeless tobacco: Never  Vaping Use   Vaping Use: Never used  Substance and Sexual Activity   Alcohol use: No   Drug use: No   Sexual activity: Yes

## 2021-06-08 ENCOUNTER — Other Ambulatory Visit: Payer: Self-pay

## 2021-06-08 DIAGNOSIS — M25512 Pain in left shoulder: Secondary | ICD-10-CM

## 2021-07-10 ENCOUNTER — Ambulatory Visit
Admission: RE | Admit: 2021-07-10 | Discharge: 2021-07-10 | Disposition: A | Discharge status: Home / Self Care | Payer: Medicare Other | Source: Ambulatory Visit | Attending: Orthopedic Surgery | Admitting: Orthopedic Surgery

## 2021-07-10 DIAGNOSIS — M25512 Pain in left shoulder: Secondary | ICD-10-CM | POA: Diagnosis not present

## 2021-07-10 DIAGNOSIS — S46012A Strain of muscle(s) and tendon(s) of the rotator cuff of left shoulder, initial encounter: Secondary | ICD-10-CM | POA: Diagnosis not present

## 2021-07-10 MED ORDER — IOPAMIDOL (ISOVUE-M 200) INJECTION 41%
12.0000 mL | Freq: Once | INTRAMUSCULAR | Status: AC
  Administered 2021-07-10: 12 mL via INTRA_ARTICULAR

## 2021-07-13 ENCOUNTER — Other Ambulatory Visit: Payer: Self-pay

## 2021-07-13 ENCOUNTER — Ambulatory Visit (INDEPENDENT_AMBULATORY_CARE_PROVIDER_SITE_OTHER): Payer: Medicare Other | Admitting: Orthopedic Surgery

## 2021-07-13 DIAGNOSIS — M75122 Complete rotator cuff tear or rupture of left shoulder, not specified as traumatic: Secondary | ICD-10-CM

## 2021-07-15 ENCOUNTER — Encounter: Payer: Self-pay | Admitting: Orthopedic Surgery

## 2021-07-15 NOTE — Progress Notes (Signed)
Office Visit Note   Patient: Billy Fields           Date of Birth: [birth year]           MRN: [id number] Visit Date: 07/13/2021 Requested by: Vernie Shanks, MD [place],  [place] [id number] PCP: Vernie Shanks, MD  Subjective: Chief Complaint  Patient presents with   Other     Scan review    HPI: Billy Fields is a 71 year old patient with left shoulder pain.  Hurts for him to drive.  He has to toss and turn to get comfortable at night.  Insidious onset of pain.  He is able to golf.  It is hard for him to lift the club out of the bag.  He does have a pontoon boat which requires climbing in and out of the boat on a ladder.  MRI scan of the left shoulder is reviewed and it does show large full-thickness near complete tear of the supraspinatus tendon with a few intact posterior fibers.  There is retraction to the superior aspect of the humeral head.  Mild muscle atrophy of the supraspinatus muscle present.  It  does look very mild when I reviewed the scan.              ROS: All systems reviewed are negative as they relate to the chief complaint within the history of present illness.  Patient denies  fevers or chills.   Assessment & Plan: Visit Diagnoses:  1. Nontraumatic complete tear of left rotator cuff     Plan: Impression is retracted rotator cuff tear on the left-hand side in a patient who is having some weakness and some mechanical symptoms.  This is been going on for many months.  His symptoms are consistent with rotator cuff pathology which is symptomatic.  He remains active playing golf and boating.  He did well with knee replacement surgery.  Risk and benefits of surgical intervention are discussed including not limited to infection nerve vessel damage shoulder stiffness as well as incomplete pain relief and read tearing of the rotator cuff repair especially in someone in his age group.  Nonetheless he does seem physiologically younger than 69.  I do not think this  rotator cuff tear has any intrinsic ability to heal on its own and has been symptomatic for many months.  Patient understands risk and benefits and wishes to proceed with rotator cuff repair.  All questions answered.  Follow-Up Instructions: No follow-ups on file.   Orders:  No orders of the defined types were placed in this encounter.  No orders of the defined types were placed in this encounter.     Procedures: No procedures performed   Clinical Data: No additional findings.  Objective: Vital Signs: There were no vitals taken for this visit.  Physical Exam:   Constitutional: Patient appears well-developed HEENT:  Head: Normocephalic Eyes:EOM are normal Neck: Normal range of motion Cardiovascular: Normal rate Pulmonary/chest: Effort normal Neurologic: Patient is alert Skin: Skin is warm Psychiatric: Patient has normal mood and affect   Ortho Exam: Ortho exam demonstrates full active and passive range of motion of the elbow wrist on the left-hand side.  Passive range of motion on the left is 60/95/170.  He does have some coarseness and catching with internal and external rotation of the left arm at 90 degrees of abduction.  Excellent subscap strength.  No Popeye deformity.  Has slight weakness to infraspinatus and supraspinatus testing on the left  compared to the right.  Specialty Comments:  No specialty comments available.  Imaging: No results found.   PMFS History: Patient Active Problem List   Diagnosis Date Noted   S/P total knee arthroplasty, right 02/07/2020   Degenerative arthritis of knee 07/05/2014   Past Medical History:  Diagnosis Date   Arthritis    GERD (gastroesophageal reflux disease)    Hyperlipidemia    Hypertension     Family History  Problem Relation Age of Onset   Heart attack Mother    Cirrhosis Father    CAD Father     Past Surgical History:  Procedure Laterality Date   COLONOSCOPY     KNEE ARTHROSCOPY Left    TOE SURGERY Right     TONSILLECTOMY     TOTAL KNEE ARTHROPLASTY Left 07/05/2014   Procedure: TOTAL KNEE ARTHROPLASTY;  Surgeon: Meredith Pel, MD;  Location: [facility];  Service: Orthopedics;  Laterality: Left;   TOTAL KNEE ARTHROPLASTY Right 02/07/2020   Procedure: RIGHT TOTAL KNEE ARTHROPLASTY;  Surgeon: Meredith Pel, MD;  Location: [facility];  Service: Orthopedics;  Laterality: Right;   VASECTOMY     VASECTOMY REVERSAL     Social History   Occupational History   Not on file  Tobacco Use   Smoking status: Former    Types: Cigarettes    Quit date: 03/27/1998    Years since quitting: 23.3   Smokeless tobacco: Never  Vaping Use   Vaping Use: Never used  Substance and Sexual Activity   Alcohol use: No   Drug use: No   Sexual activity: Yes

## 2021-07-17 DIAGNOSIS — R509 Fever, unspecified: Secondary | ICD-10-CM | POA: Diagnosis not present

## 2021-07-17 DIAGNOSIS — R059 Cough, unspecified: Secondary | ICD-10-CM | POA: Diagnosis not present

## 2021-07-17 DIAGNOSIS — B349 Viral infection, unspecified: Secondary | ICD-10-CM | POA: Diagnosis not present

## 2021-07-17 DIAGNOSIS — U071 COVID-19: Secondary | ICD-10-CM | POA: Diagnosis not present

## 2021-07-19 ENCOUNTER — Encounter: Payer: Self-pay | Admitting: Orthopedic Surgery

## 2021-07-25 DIAGNOSIS — L853 Xerosis cutis: Secondary | ICD-10-CM | POA: Diagnosis not present

## 2021-07-25 DIAGNOSIS — Z85828 Personal history of other malignant neoplasm of skin: Secondary | ICD-10-CM | POA: Diagnosis not present

## 2021-07-25 DIAGNOSIS — L218 Other seborrheic dermatitis: Secondary | ICD-10-CM | POA: Diagnosis not present

## 2021-07-25 DIAGNOSIS — D2271 Melanocytic nevi of right lower limb, including hip: Secondary | ICD-10-CM | POA: Diagnosis not present

## 2021-07-25 DIAGNOSIS — D225 Melanocytic nevi of trunk: Secondary | ICD-10-CM | POA: Diagnosis not present

## 2021-07-25 DIAGNOSIS — L570 Actinic keratosis: Secondary | ICD-10-CM | POA: Diagnosis not present

## 2021-07-25 DIAGNOSIS — L821 Other seborrheic keratosis: Secondary | ICD-10-CM | POA: Diagnosis not present

## 2021-07-25 DIAGNOSIS — D2272 Melanocytic nevi of left lower limb, including hip: Secondary | ICD-10-CM | POA: Diagnosis not present

## 2021-07-30 ENCOUNTER — Encounter: Payer: Medicare Other | Admitting: Orthopedic Surgery

## 2021-08-01 DIAGNOSIS — J209 Acute bronchitis, unspecified: Secondary | ICD-10-CM | POA: Diagnosis not present

## 2021-08-01 DIAGNOSIS — R059 Cough, unspecified: Secondary | ICD-10-CM | POA: Diagnosis not present

## 2021-08-01 DIAGNOSIS — J019 Acute sinusitis, unspecified: Secondary | ICD-10-CM | POA: Diagnosis not present

## 2021-08-20 ENCOUNTER — Telehealth: Payer: Self-pay | Admitting: Radiology

## 2021-08-20 ENCOUNTER — Encounter: Payer: Medicare Other | Admitting: Orthopedic Surgery

## 2021-08-20 ENCOUNTER — Encounter: Payer: Self-pay | Admitting: Orthopedic Surgery

## 2021-08-20 ENCOUNTER — Other Ambulatory Visit: Payer: Self-pay | Admitting: Surgical

## 2021-08-20 DIAGNOSIS — M948X1 Other specified disorders of cartilage, shoulder: Secondary | ICD-10-CM | POA: Diagnosis not present

## 2021-08-20 DIAGNOSIS — M94212 Chondromalacia, left shoulder: Secondary | ICD-10-CM | POA: Diagnosis not present

## 2021-08-20 DIAGNOSIS — S43432A Superior glenoid labrum lesion of left shoulder, initial encounter: Secondary | ICD-10-CM | POA: Diagnosis not present

## 2021-08-20 DIAGNOSIS — M7522 Bicipital tendinitis, left shoulder: Secondary | ICD-10-CM | POA: Diagnosis not present

## 2021-08-20 DIAGNOSIS — M659 Synovitis and tenosynovitis, unspecified: Secondary | ICD-10-CM | POA: Diagnosis not present

## 2021-08-20 DIAGNOSIS — M75122 Complete rotator cuff tear or rupture of left shoulder, not specified as traumatic: Secondary | ICD-10-CM | POA: Diagnosis not present

## 2021-08-20 DIAGNOSIS — G8918 Other acute postprocedural pain: Secondary | ICD-10-CM | POA: Diagnosis not present

## 2021-08-20 MED ORDER — METHOCARBAMOL 500 MG PO TABS: 500.0000 mg | ORAL_TABLET | Freq: Three times a day (TID) | ORAL | 0 refills | Status: DC | PRN

## 2021-08-20 MED ORDER — OXYCODONE HCL 5 MG PO TABS: 5.0000 mg | ORAL_TABLET | ORAL | 0 refills | Status: DC | PRN

## 2021-08-20 NOTE — Telephone Encounter (Signed)
Patient's wife called stating that he had surgery this morning and his medications still have not been sent to the pharmacy. When reviewed, medications were sent to [facility] on [address]. She states that she is unsure how that happened as that is not their regular pharmacy, but she will pick up from there.

## 2021-08-27 ENCOUNTER — Other Ambulatory Visit: Payer: Self-pay

## 2021-08-27 ENCOUNTER — Ambulatory Visit (INDEPENDENT_AMBULATORY_CARE_PROVIDER_SITE_OTHER): Payer: Medicare Other | Admitting: Orthopedic Surgery

## 2021-08-27 ENCOUNTER — Ambulatory Visit (INDEPENDENT_AMBULATORY_CARE_PROVIDER_SITE_OTHER): Payer: Medicare Other

## 2021-08-27 ENCOUNTER — Encounter: Payer: Self-pay | Admitting: Orthopedic Surgery

## 2021-08-27 DIAGNOSIS — M79642 Pain in left hand: Secondary | ICD-10-CM | POA: Diagnosis not present

## 2021-08-27 NOTE — Progress Notes (Signed)
? ?  Post-Op Visit Note ?  ?Patient: Billy Fields           ?Date of Birth: May 04, 1951           ?MRN: [id number] ?Visit Date: 08/27/2021 ?PCP: Vernie Shanks, MD ? ? ?Assessment & Plan: ? ?Chief Complaint:  ?Chief Complaint  ?Patient presents with  ? Left Shoulder - Routine Post Op  ? ?Visit Diagnoses:  ?1. Pain of left hand   ? ? ?Plan: Darlene is a patient who is now a week out left shoulder rotator cuff tear repair and biceps tenodesis.  Using CPM 45 minutes 2-3 times a day.  Sutures DC'd.  Shoulder predictably stiff.  Deltoid fires.  DC sling in 1 week.  Start physical therapy for active assisted range of motion and passive range of motion.  Follow-up in 3 weeks for clinical recheck.  Left wrist has some swelling today.  Radiographs are negative for fracture.  This looks to be the expected amount of swelling that accumulates in the arm following arthroscopic surgery. ? ?Follow-Up Instructions: No follow-ups on file.  ? ?Orders:  ?Orders Placed This Encounter  ?Procedures  ? XR Wrist Complete Left  ? ?No orders of the defined types were placed in this encounter. ? ? ?Imaging: ?No results found. ? ?PMFS History: ?Patient Active Problem List  ? Diagnosis Date Noted  ? S/P total knee arthroplasty, right 02/07/2020  ? Degenerative arthritis of knee 07/05/2014  ? ?Past Medical History:  ?Diagnosis Date  ? Arthritis   ? GERD (gastroesophageal reflux disease)   ? Hyperlipidemia   ? Hypertension   ?  ?Family History  ?Problem Relation Age of Onset  ? Heart attack Mother   ? Cirrhosis Father   ? CAD Father   ?  ?Past Surgical History:  ?Procedure Laterality Date  ? COLONOSCOPY    ? KNEE ARTHROSCOPY Left   ? TOE SURGERY Right   ? TONSILLECTOMY    ? TOTAL KNEE ARTHROPLASTY Left 07/05/2014  ? Procedure: TOTAL KNEE ARTHROPLASTY;  Surgeon: Meredith Pel, MD;  Location: [facility];  Service: Orthopedics;  Laterality: Left;  ? TOTAL KNEE ARTHROPLASTY Right 02/07/2020  ? Procedure: RIGHT TOTAL KNEE ARTHROPLASTY;  Surgeon: Meredith Pel, MD;  Location: [facility];  Service: Orthopedics;  Laterality: Right;  ? VASECTOMY    ? VASECTOMY REVERSAL    ? ?Social History  ? ?Occupational History  ? Not on file  ?Tobacco Use  ? Smoking status: Former  ?  Types: Cigarettes  ?  Quit date: 03/27/1998  ?  Years since quitting: 23.4  ? Smokeless tobacco: Never  ?Vaping Use  ? Vaping Use: Never used  ?Substance and Sexual Activity  ? Alcohol use: No  ? Drug use: No  ? Sexual activity: Yes  ? ? ? ?

## 2021-08-28 ENCOUNTER — Encounter: Payer: Self-pay | Admitting: Orthopedic Surgery

## 2021-08-28 DIAGNOSIS — M19032 Primary osteoarthritis, left wrist: Secondary | ICD-10-CM | POA: Diagnosis not present

## 2021-08-28 DIAGNOSIS — R739 Hyperglycemia, unspecified: Secondary | ICD-10-CM | POA: Diagnosis not present

## 2021-08-28 DIAGNOSIS — E782 Mixed hyperlipidemia: Secondary | ICD-10-CM | POA: Diagnosis not present

## 2021-08-28 DIAGNOSIS — M7989 Other specified soft tissue disorders: Secondary | ICD-10-CM | POA: Diagnosis not present

## 2021-08-28 DIAGNOSIS — I129 Hypertensive chronic kidney disease with stage 1 through stage 4 chronic kidney disease, or unspecified chronic kidney disease: Secondary | ICD-10-CM | POA: Diagnosis not present

## 2021-08-28 DIAGNOSIS — I10 Essential (primary) hypertension: Secondary | ICD-10-CM | POA: Diagnosis not present

## 2021-08-28 DIAGNOSIS — M19041 Primary osteoarthritis, right hand: Secondary | ICD-10-CM | POA: Diagnosis not present

## 2021-08-28 DIAGNOSIS — N1831 Chronic kidney disease, stage 3a: Secondary | ICD-10-CM | POA: Diagnosis not present

## 2021-08-29 ENCOUNTER — Encounter: Payer: Self-pay | Admitting: Orthopedic Surgery

## 2021-09-04 DIAGNOSIS — S43422D Sprain of left rotator cuff capsule, subsequent encounter: Secondary | ICD-10-CM | POA: Diagnosis not present

## 2021-09-17 ENCOUNTER — Other Ambulatory Visit: Payer: Self-pay

## 2021-09-17 ENCOUNTER — Ambulatory Visit (INDEPENDENT_AMBULATORY_CARE_PROVIDER_SITE_OTHER): Payer: Medicare Other | Admitting: Surgical

## 2021-09-17 DIAGNOSIS — M75122 Complete rotator cuff tear or rupture of left shoulder, not specified as traumatic: Secondary | ICD-10-CM

## 2021-09-17 MED ORDER — AMOXICILLIN 500 MG PO TABS: ORAL_TABLET | ORAL | 0 refills | Status: DC

## 2021-09-20 ENCOUNTER — Encounter: Payer: Self-pay | Admitting: Orthopedic Surgery

## 2021-09-20 NOTE — Progress Notes (Signed)
? ?Post-Op Visit Note ?  ?Patient: Billy Fields           ?Date of Birth: [birth year]           ?MRN: [id number] ?Visit Date: 09/17/2021 ?PCP: Vernie Shanks, MD ? ? ?Assessment & Plan: ? ?Chief Complaint:  ?Chief Complaint  ?Patient presents with  ? Left Shoulder - Routine Post Op  ? ?Visit Diagnoses:  ?1. Nontraumatic complete tear of left rotator cuff   ? ? ?Plan: Patient is a 71 year old male who presents s/p left shoulder rotator cuff tear repair with biceps tenodesis.  He is about 1 month out.  Doing well overall.  No significant problems.  He is in physical therapy at [facility] physical therapy with ART.  He is mostly working on stretching and passive range of motion.  Left wrist pain and swelling that he was dealing with at last appointment has completely resolved.  Able to sleep on his back but has not been able to sleep on his side yet.  Takes occasional Aleve and Tylenol for his symptoms.  Has not had to take any oxycodone for weeks.  On exam, has 30 degrees external rotation, 70 degrees abduction, 130 degrees forward flexion.  Incisions are healing well without evidence of infection or dehiscence.  Axillary nerve intact with deltoid firing.  Supraspinatus strength rated 5 -/5 with infraspinatus strength rated 5 -/5.  Subscap strength rated 5/5.  Bicep contour is equivalent to the contralateral side.  Bicep and supination strength is 5/5 and 5 -/5, respectively.  Plan is continue with physical therapy and okay to start active range of motion with okay to start strengthening of the rotator cuff in about 2 weeks.  This was written on a prescription pad paper and given to patient to have him give 2 g for physical therapy.  He wants to return to golfing and to tilling but cautioned him against this.  Plan to have him return to telling and likely putting/chipping at his next appointment in 4 weeks.  Patient agreed with plan.  Follow-up in 4 weeks with Dr. Marlou Sa. ? ?Follow-Up Instructions: No follow-ups on  file.  ? ?Orders:  ?No orders of the defined types were placed in this encounter. ? ?Meds ordered this encounter  ?Medications  ? amoxicillin (AMOXIL) 500 MG tablet  ?  Sig: 2 grams 1 hour prior to dental procedure  ?  Dispense:  8 tablet  ?  Refill:  0  ? ? ?Imaging: ?No results found. ? ?PMFS History: ?Patient Active Problem List  ? Diagnosis Date Noted  ? S/P total knee arthroplasty, right 02/07/2020  ? Degenerative arthritis of knee 07/05/2014  ? ?Past Medical History:  ?Diagnosis Date  ? Arthritis   ? GERD (gastroesophageal reflux disease)   ? Hyperlipidemia   ? Hypertension   ?  ?Family History  ?Problem Relation Age of Onset  ? Heart attack Mother   ? Cirrhosis Father   ? CAD Father   ?  ?Past Surgical History:  ?Procedure Laterality Date  ? COLONOSCOPY    ? KNEE ARTHROSCOPY Left   ? TOE SURGERY Right   ? TONSILLECTOMY    ? TOTAL KNEE ARTHROPLASTY Left 07/05/2014  ? Procedure: TOTAL KNEE ARTHROPLASTY;  Surgeon: Meredith Pel, MD;  Location: [facility];  Service: Orthopedics;  Laterality: Left;  ? TOTAL KNEE ARTHROPLASTY Right 02/07/2020  ? Procedure: RIGHT TOTAL KNEE ARTHROPLASTY;  Surgeon: Meredith Pel, MD;  Location: [facility];  Service: Orthopedics;  Laterality: Right;  ? VASECTOMY    ? VASECTOMY REVERSAL    ? ?Social History  ? ?Occupational History  ? Not on file  ?Tobacco Use  ? Smoking status: Former  ?  Types: Cigarettes  ?  Quit date: 03/27/1998  ?  Years since quitting: 23.5  ? Smokeless tobacco: Never  ?Vaping Use  ? Vaping Use: Never used  ?Substance and Sexual Activity  ? Alcohol use: No  ? Drug use: No  ? Sexual activity: Yes  ? ? ? ?

## 2021-09-21 DIAGNOSIS — S43422D Sprain of left rotator cuff capsule, subsequent encounter: Secondary | ICD-10-CM | POA: Diagnosis not present

## 2021-10-04 DIAGNOSIS — S43422D Sprain of left rotator cuff capsule, subsequent encounter: Secondary | ICD-10-CM | POA: Diagnosis not present

## 2021-10-12 ENCOUNTER — Ambulatory Visit (INDEPENDENT_AMBULATORY_CARE_PROVIDER_SITE_OTHER): Payer: Medicare Other | Admitting: Orthopedic Surgery

## 2021-10-12 DIAGNOSIS — M75122 Complete rotator cuff tear or rupture of left shoulder, not specified as traumatic: Secondary | ICD-10-CM

## 2021-10-13 ENCOUNTER — Encounter: Payer: Self-pay | Admitting: Orthopedic Surgery

## 2021-10-13 NOTE — Progress Notes (Signed)
? ?  Post-Op Visit Note ?  ?Patient: Billy Fields           ?Date of Birth: 1951-06-19           ?MRN: [id number] ?Visit Date: 10/12/2021 ?PCP: Vernie Shanks, MD ? ? ?Assessment & Plan: ? ?Chief Complaint:  ?Chief Complaint  ?Patient presents with  ? Left Shoulder - Routine Post Op  ? ?Visit Diagnoses:  ?1. Nontraumatic complete tear of left rotator cuff   ? ? ?Plan: Billy Fields is a patient is now 2 months out left shoulder rotator cuff tear repair.  Doing therapy once every 2 weeks as well as a home exercise program.  He is doing well with no problems.  He would like to do subtalar work with a very light tiller in the soft ground.  Also his interested in getting back to playing golf at some point.  On examination he has good range of motion and good strength.  Slight amount of crepitus with range of motion of that left shoulder but his strength is good infraspinatus and subscap muscle testing.  Plan at this time is 4-week return.  Continue with home exercise program.  Chipping only for now no golf swings.  Okay for the [facility] because it sounds like he is really just staring with Lovena Le. ? ?Follow-Up Instructions: No follow-ups on file.  ? ?Orders:  ?No orders of the defined types were placed in this encounter. ? ?No orders of the defined types were placed in this encounter. ? ? ?Imaging: ?No results found. ? ?PMFS History: ?Patient Active Problem List  ? Diagnosis Date Noted  ? S/P total knee arthroplasty, right 02/07/2020  ? Degenerative arthritis of knee 07/05/2014  ? ?Past Medical History:  ?Diagnosis Date  ? Arthritis   ? GERD (gastroesophageal reflux disease)   ? Hyperlipidemia   ? Hypertension   ?  ?Family History  ?Problem Relation Age of Onset  ? Heart attack Mother   ? Cirrhosis Father   ? CAD Father   ?  ?Past Surgical History:  ?Procedure Laterality Date  ? COLONOSCOPY    ? KNEE ARTHROSCOPY Left   ? TOE SURGERY Right   ? TONSILLECTOMY    ? TOTAL KNEE ARTHROPLASTY Left 07/05/2014  ? Procedure: TOTAL KNEE  ARTHROPLASTY;  Surgeon: Meredith Pel, MD;  Location: [facility];  Service: Orthopedics;  Laterality: Left;  ? TOTAL KNEE ARTHROPLASTY Right 02/07/2020  ? Procedure: RIGHT TOTAL KNEE ARTHROPLASTY;  Surgeon: Meredith Pel, MD;  Location: [facility];  Service: Orthopedics;  Laterality: Right;  ? VASECTOMY    ? VASECTOMY REVERSAL    ? ?Social History  ? ?Occupational History  ? Not on file  ?Tobacco Use  ? Smoking status: Former  ?  Types: Cigarettes  ?  Quit date: 03/27/1998  ?  Years since quitting: 23.5  ? Smokeless tobacco: Never  ?Vaping Use  ? Vaping Use: Never used  ?Substance and Sexual Activity  ? Alcohol use: No  ? Drug use: No  ? Sexual activity: Yes  ? ? ? ?

## 2021-10-25 DIAGNOSIS — S43422D Sprain of left rotator cuff capsule, subsequent encounter: Secondary | ICD-10-CM | POA: Diagnosis not present

## 2021-11-09 ENCOUNTER — Encounter: Payer: Self-pay | Admitting: Orthopedic Surgery

## 2021-11-09 ENCOUNTER — Ambulatory Visit (INDEPENDENT_AMBULATORY_CARE_PROVIDER_SITE_OTHER): Payer: Medicare Other | Admitting: Orthopedic Surgery

## 2021-11-09 VITALS — Ht 72.0 in | Wt 217.0 lb

## 2021-11-09 DIAGNOSIS — M75122 Complete rotator cuff tear or rupture of left shoulder, not specified as traumatic: Secondary | ICD-10-CM

## 2021-11-10 ENCOUNTER — Encounter: Payer: Self-pay | Admitting: Orthopedic Surgery

## 2021-11-10 NOTE — Progress Notes (Signed)
   Post-Op Visit Note   Patient: Billy Fields           Date of Birth: Nov 09, 1950           MRN: [id number] Visit Date: 11/09/2021 PCP: Vernie Shanks, MD   Assessment & Plan:  Chief Complaint:  Chief Complaint  Patient presents with   Left Shoulder - Follow-up    08/20/2021 Left shoulder RCTR, BT   Visit Diagnoses:  1. Nontraumatic complete tear of left rotator cuff     Plan: Billy Fields is a 71 year old patient who is now 3 months out left shoulder rotator cuff tear repair biceps tenodesis.  Patient states he feels great and has no complaints.  Not taking any medication for pain.  Has worked some in the garden and done lower extremity workouts in the gym along with a home exercise program from his physical therapist.  He has been doing some lifting but close to his body.  He has been doing golf which is primarily chipping but no full swings yet.  On examination he has good range of motion and strength.  There is a small amount of crepitus with internal/external rotation of that left arm more consistent with scar tissue in the subacromial space as opposed to rotator cuff pathology.  Plan at this time is to let him start doing more progressive type golf activities.  Told him to avoid lifting loads more than 10 pounds out away from his body.  He will follow-up with [place] as needed.  Follow-Up Instructions: Return if symptoms worsen or fail to improve.   Orders:  No orders of the defined types were placed in this encounter.  No orders of the defined types were placed in this encounter.   Imaging: No results found.  PMFS History: Patient Active Problem List   Diagnosis Date Noted   S/P total knee arthroplasty, right 02/07/2020   Degenerative arthritis of knee 07/05/2014   Past Medical History:  Diagnosis Date   Arthritis    GERD (gastroesophageal reflux disease)    Hyperlipidemia    Hypertension     Family History  Problem Relation Age of Onset   Heart attack Mother     Cirrhosis Father    CAD Father     Past Surgical History:  Procedure Laterality Date   COLONOSCOPY     KNEE ARTHROSCOPY Left    TOE SURGERY Right    TONSILLECTOMY     TOTAL KNEE ARTHROPLASTY Left 07/05/2014   Procedure: TOTAL KNEE ARTHROPLASTY;  Surgeon: Meredith Pel, MD;  Location: [facility];  Service: Orthopedics;  Laterality: Left;   TOTAL KNEE ARTHROPLASTY Right 02/07/2020   Procedure: RIGHT TOTAL KNEE ARTHROPLASTY;  Surgeon: Meredith Pel, MD;  Location: [facility];  Service: Orthopedics;  Laterality: Right;   VASECTOMY     VASECTOMY REVERSAL     Social History   Occupational History   Not on file  Tobacco Use   Smoking status: Former    Types: Cigarettes    Quit date: 03/27/1998    Years since quitting: 23.6   Smokeless tobacco: Never  Vaping Use   Vaping Use: Never used  Substance and Sexual Activity   Alcohol use: No   Drug use: No   Sexual activity: Yes

## 2021-11-28 DIAGNOSIS — H52203 Unspecified astigmatism, bilateral: Secondary | ICD-10-CM | POA: Diagnosis not present

## 2021-11-28 DIAGNOSIS — H35373 Puckering of macula, bilateral: Secondary | ICD-10-CM | POA: Diagnosis not present

## 2021-11-28 DIAGNOSIS — H353131 Nonexudative age-related macular degeneration, bilateral, early dry stage: Secondary | ICD-10-CM | POA: Diagnosis not present

## 2021-11-28 DIAGNOSIS — H532 Diplopia: Secondary | ICD-10-CM | POA: Diagnosis not present

## 2022-01-22 DIAGNOSIS — I872 Venous insufficiency (chronic) (peripheral): Secondary | ICD-10-CM | POA: Diagnosis not present

## 2022-01-22 DIAGNOSIS — L308 Other specified dermatitis: Secondary | ICD-10-CM | POA: Diagnosis not present

## 2022-01-22 DIAGNOSIS — L812 Freckles: Secondary | ICD-10-CM | POA: Diagnosis not present

## 2022-01-22 DIAGNOSIS — Z85820 Personal history of malignant melanoma of skin: Secondary | ICD-10-CM | POA: Diagnosis not present

## 2022-01-22 DIAGNOSIS — L570 Actinic keratosis: Secondary | ICD-10-CM | POA: Diagnosis not present

## 2022-01-22 DIAGNOSIS — Z85828 Personal history of other malignant neoplasm of skin: Secondary | ICD-10-CM | POA: Diagnosis not present

## 2022-01-22 DIAGNOSIS — D225 Melanocytic nevi of trunk: Secondary | ICD-10-CM | POA: Diagnosis not present

## 2022-01-22 DIAGNOSIS — I8311 Varicose veins of right lower extremity with inflammation: Secondary | ICD-10-CM | POA: Diagnosis not present

## 2022-01-22 DIAGNOSIS — D1801 Hemangioma of skin and subcutaneous tissue: Secondary | ICD-10-CM | POA: Diagnosis not present

## 2022-01-22 DIAGNOSIS — L821 Other seborrheic keratosis: Secondary | ICD-10-CM | POA: Diagnosis not present

## 2022-01-22 DIAGNOSIS — I8312 Varicose veins of left lower extremity with inflammation: Secondary | ICD-10-CM | POA: Diagnosis not present

## 2022-02-13 ENCOUNTER — Other Ambulatory Visit (HOSPITAL_BASED_OUTPATIENT_CLINIC_OR_DEPARTMENT_OTHER): Payer: Self-pay

## 2022-02-20 ENCOUNTER — Emergency Department (HOSPITAL_COMMUNITY): Payer: Medicare Other

## 2022-02-20 ENCOUNTER — Encounter (HOSPITAL_COMMUNITY): Payer: Self-pay

## 2022-02-20 ENCOUNTER — Other Ambulatory Visit: Payer: Self-pay

## 2022-02-20 ENCOUNTER — Emergency Department (HOSPITAL_COMMUNITY)
Admission: EM | Admit: 2022-02-20 | Discharge: 2022-02-20 | Disposition: A | Discharge status: Home / Self Care | Payer: Medicare Other | Attending: Emergency Medicine | Admitting: Emergency Medicine

## 2022-02-20 DIAGNOSIS — Z7982 Long term (current) use of aspirin: Secondary | ICD-10-CM | POA: Insufficient documentation

## 2022-02-20 DIAGNOSIS — I10 Essential (primary) hypertension: Secondary | ICD-10-CM | POA: Diagnosis not present

## 2022-02-20 DIAGNOSIS — R600 Localized edema: Secondary | ICD-10-CM | POA: Diagnosis not present

## 2022-02-20 DIAGNOSIS — R42 Dizziness and giddiness: Secondary | ICD-10-CM | POA: Diagnosis present

## 2022-02-20 DIAGNOSIS — R231 Pallor: Secondary | ICD-10-CM | POA: Diagnosis not present

## 2022-02-20 DIAGNOSIS — I959 Hypotension, unspecified: Secondary | ICD-10-CM | POA: Diagnosis not present

## 2022-02-20 DIAGNOSIS — I471 Supraventricular tachycardia: Secondary | ICD-10-CM | POA: Insufficient documentation

## 2022-02-20 DIAGNOSIS — D72829 Elevated white blood cell count, unspecified: Secondary | ICD-10-CM | POA: Diagnosis not present

## 2022-02-20 DIAGNOSIS — R61 Generalized hyperhidrosis: Secondary | ICD-10-CM | POA: Diagnosis not present

## 2022-02-20 DIAGNOSIS — R079 Chest pain, unspecified: Secondary | ICD-10-CM | POA: Diagnosis not present

## 2022-02-20 DIAGNOSIS — J9809 Other diseases of bronchus, not elsewhere classified: Secondary | ICD-10-CM | POA: Diagnosis not present

## 2022-02-20 DIAGNOSIS — I4891 Unspecified atrial fibrillation: Secondary | ICD-10-CM | POA: Diagnosis not present

## 2022-02-20 DIAGNOSIS — R0602 Shortness of breath: Secondary | ICD-10-CM | POA: Diagnosis not present

## 2022-02-20 DIAGNOSIS — R000 Tachycardia, unspecified: Secondary | ICD-10-CM | POA: Diagnosis not present

## 2022-02-20 DIAGNOSIS — Z79899 Other long term (current) drug therapy: Secondary | ICD-10-CM | POA: Insufficient documentation

## 2022-02-20 DIAGNOSIS — E878 Other disorders of electrolyte and fluid balance, not elsewhere classified: Secondary | ICD-10-CM | POA: Diagnosis not present

## 2022-02-20 DIAGNOSIS — I499 Cardiac arrhythmia, unspecified: Secondary | ICD-10-CM | POA: Diagnosis not present

## 2022-02-20 DIAGNOSIS — J9811 Atelectasis: Secondary | ICD-10-CM | POA: Diagnosis not present

## 2022-02-20 LAB — CBC
HCT: 44 % (ref 39.0–52.0)
Hemoglobin: 14.9 g/dL (ref 13.0–17.0)
MCH: 30 pg (ref 26.0–34.0)
MCHC: 33.9 g/dL (ref 30.0–36.0)
MCV: 88.5 fL (ref 80.0–100.0)
Platelets: 159 10*3/uL (ref 150–400)
RBC: 4.97 MIL/uL (ref 4.22–5.81)
RDW: 12.5 % (ref 11.5–15.5)
WBC: 11.3 10*3/uL — ABNORMAL HIGH (ref 4.0–10.5)
nRBC: 0 % (ref 0.0–0.2)

## 2022-02-20 LAB — COMPREHENSIVE METABOLIC PANEL
ALT: 17 U/L (ref 0–44)
AST: 23 U/L (ref 15–41)
Albumin: 3.9 g/dL (ref 3.5–5.0)
Alkaline Phosphatase: 76 U/L (ref 38–126)
Anion gap: 11 (ref 5–15)
BUN: 22 mg/dL (ref 8–23)
CO2: 19 mmol/L — ABNORMAL LOW (ref 22–32)
Calcium: 9.4 mg/dL (ref 8.9–10.3)
Chloride: 114 mmol/L — ABNORMAL HIGH (ref 98–111)
Creatinine, Ser: 2.02 mg/dL — ABNORMAL HIGH (ref 0.61–1.24)
GFR, Estimated: 34 mL/min — ABNORMAL LOW (ref >60)
Glucose, Bld: 91 mg/dL (ref 70–99)
Potassium: 4.2 mmol/L (ref 3.5–5.1)
Sodium: 144 mmol/L (ref 135–145)
Total Bilirubin: 1.4 mg/dL — ABNORMAL HIGH (ref 0.3–1.2)
Total Protein: 6.1 g/dL — ABNORMAL LOW (ref 6.5–8.1)

## 2022-02-20 LAB — BRAIN NATRIURETIC PEPTIDE: B Natriuretic Peptide: 103.9 pg/mL — ABNORMAL HIGH (ref 0.0–100.0)

## 2022-02-20 LAB — TROPONIN I (HIGH SENSITIVITY)
Troponin I (High Sensitivity): 9 ng/L (ref <18)
Troponin I (High Sensitivity): 10 ng/L (ref <18)

## 2022-02-20 MED ORDER — LACTATED RINGERS IV BOLUS
1000.0000 mL | Freq: Once | INTRAVENOUS | Status: AC
  Administered 2022-02-20: 1000 mL via INTRAVENOUS

## 2022-02-20 MED ORDER — IOHEXOL 350 MG/ML SOLN
60.0000 mL | Freq: Once | INTRAVENOUS | Status: AC | PRN
  Administered 2022-02-20: 60 mL via INTRAVENOUS

## 2022-02-20 NOTE — Discharge Instructions (Addendum)
Note your work-up today was overall reassuring.  Most likely culprit for your spontaneous SVT likely dehydration given resolution with administration of fluids while in the emergency department.  As we discussed, I have placed a ambulatory referral for cardiology.  Please follow up with them at your earliest convenience.  Please do not hesitate to return to the emergency department if the worrisome signs and symptoms we discussed become apparent.

## 2022-02-20 NOTE — ED Notes (Signed)
Pt returned from CT °

## 2022-02-20 NOTE — ED Notes (Signed)
Patient verbalizes understanding of discharge instructions. Opportunity for questioning and answers were provided. Armband removed by staff, pt discharged from ED ambulatory.   

## 2022-02-20 NOTE — ED Triage Notes (Signed)
Pt arrives via EMS. Pt was outside doing some gardening. Pt states he broke out into a sweat, felt lightheaded and had some sob around 1730. Upon EMS arrival, pt had a HR in the 299M and a systolic in the 42A. EMS administered 1025m of ns, bp improved and HR is now in the 130s. PT currently AxOx4 and denies chest pain, sob or any other associated symptoms.

## 2022-02-20 NOTE — ED Provider Notes (Signed)
[facility] EMERGENCY DEPARTMENT Provider Note   CSN: [id number] Arrival date & time: 02/20/22  1851     History  Chief Complaint  Patient presents with  . Tachycardia  . Dizziness    Billy Fields is a 71 y.o. male.   Dizziness   71 year old male presents emergency department after an episode this evening around 530 of shortness of breath, diaphoresis.  He states that he was tending to his garden when the episode occurred when he was moving around.  Denies any known chest pain during this episode.  He states that he sat down for approximately 20 minutes when symptoms resided spontaneously.  His wife brought him to the emergency department.  He denies any known cardiac or respiratory illness.  He states he smokes cigarettes but quit in 1999.  Denies illicit drug use including cocaine.  He has not had any symptoms since episode earlier today.  Per EMS, patient had a heart rate in the 160s when 100 mL normal saline was bolused with a decrease in heart rate to 130.  Patient is currently not complaining of fever, chills, night sweats, shortness of breath, chest pain, nausea, back pain, abdominal pain.  He denies history of DVT/PE, hormone therapy, immobilization, surgery.  Past medical history significant for GERD, hyperlipidemia, hypertension, arthritis.  Home Medications Prior to Admission medications   Medication Sig Start Date End Date Taking? Authorizing Provider  amoxicillin (AMOXIL) 500 MG tablet 2 grams 1 hour prior to dental procedure 09/17/21   Magnant, Gerrianne Scale, PA-C  aspirin 81 MG chewable tablet Chew 1 tablet (81 mg total) by mouth daily. 02/10/20   Magnant, Charles L, PA-C  atorvastatin (LIPITOR) 10 MG tablet Take 10 mg by mouth daily at 6 PM.    [provider]  COVID-19 mRNA vaccine, Moderna, 100 MCG/0.5ML injection Inject into the muscle. 10/25/20   Carlyle Basques, MD  losartan (COZAAR) 100 MG tablet Take 100 mg by mouth daily.  01/30/17    [provider]  methocarbamol (ROBAXIN) 500 MG tablet Take 1 tablet (500 mg total) by mouth every 8 (eight) hours as needed for muscle spasms. Patient not taking: Reported on 11/09/2021 08/20/21   Magnant, Gerrianne Scale, PA-C  Multiple Vitamin (MULTIVITAMIN WITH MINERALS) TABS tablet Take 1 tablet by mouth daily.    [provider]  oxyCODONE (OXY IR/ROXICODONE) 5 MG immediate release tablet Take 1 tablet (5 mg total) by mouth every 4 (four) hours as needed for moderate pain (pain score 4-6). Patient not taking: Reported on 11/09/2021 08/20/21   Magnant, Gerrianne Scale, PA-C  terazosin (HYTRIN) 2 MG capsule Take 2 mg by mouth at bedtime. 08/30/19   [provider]      Allergies    Amlodipine besylate    Review of Systems   Review of Systems  All other systems reviewed and are negative.   Physical Exam Updated Vital Signs BP 121/84   Pulse 74   Temp 97.9 F (36.6 C)   Resp 20   Ht 6' (1.829 m)   Wt 97.5 kg   SpO2 95%   BMI 29.16 kg/m  Physical Exam Vitals and nursing note reviewed.  Constitutional:      General: He is not in acute distress.    Appearance: Normal appearance. He is well-developed.  HENT:     Head: Normocephalic and atraumatic.  Eyes:     Conjunctiva/sclera: Conjunctivae normal.  Cardiovascular:     Rate and Rhythm: Regular rhythm. Tachycardia present.  Pulses: Normal pulses.     Heart sounds: No murmur heard. Pulmonary:     Effort: Pulmonary effort is normal. No respiratory distress.     Breath sounds: Rales present.     Comments: Mild Rales auscultated on bilateral lung bases. Abdominal:     Palpations: Abdomen is soft.     Tenderness: There is no abdominal tenderness. There is no right CVA tenderness or left CVA tenderness.  Musculoskeletal:        General: No swelling.     Cervical back: Normal range of motion and neck supple.     Comments: 0-1+ bilateral lower extremity edema noted.  Skin:    General: Skin is warm and dry.      Capillary Refill: Capillary refill takes less than 2 seconds.  Neurological:     Mental Status: He is alert.  Psychiatric:        Mood and Affect: Mood normal.     ED Results / Procedures / Treatments   Labs (all labs ordered are listed, but only abnormal results are displayed) Labs Reviewed  CBC - Abnormal; Notable for the following components:      Result Value   WBC 11.3 (*)    All other components within normal limits  COMPREHENSIVE METABOLIC PANEL - Abnormal; Notable for the following components:   Chloride 114 (*)    CO2 19 (*)    Creatinine, Ser 2.02 (*)    Total Protein 6.1 (*)    Total Bilirubin 1.4 (*)    GFR, Estimated 34 (*)    All other components within normal limits  BRAIN NATRIURETIC PEPTIDE - Abnormal; Notable for the following components:   B Natriuretic Peptide 103.9 (*)    All other components within normal limits  TROPONIN I (HIGH SENSITIVITY)  TROPONIN I (HIGH SENSITIVITY)    EKG None  Radiology CT Angio Chest PE W and/or Wo Contrast  Result Date: 02/20/2022 CLINICAL DATA:  Pulmonary embolism suspected, high probability. Shortness of breath, tachycardia, and dizziness. EXAM: CT ANGIOGRAPHY CHEST WITH CONTRAST TECHNIQUE: Multidetector CT imaging of the chest was performed using the standard protocol during bolus administration of intravenous contrast. Multiplanar CT image reconstructions and MIPs were obtained to evaluate the vascular anatomy. RADIATION DOSE REDUCTION: This exam was performed according to the departmental dose-optimization program which includes automated exposure control, adjustment of the mA and/or kV according to patient size and/or use of iterative reconstruction technique. CONTRAST:  36m OMNIPAQUE IOHEXOL 350 MG/ML SOLN COMPARISON:  09/12/2008. FINDINGS: Cardiovascular: The heart is borderline enlarged and there is a trace pericardial effusion. Scattered coronary artery calcifications are noted. There is atherosclerotic calcification of  the aorta without evidence of aneurysm. The pulmonary trunk is normal in caliber. No pulmonary artery filling defect is identified. Mediastinum/Nodes: No mediastinal, hilar, or axillary lymphadenopathy. The thyroid gland, trachea, and esophagus are within normal limits. There is a small hiatal hernia. Lungs/Pleura: Minimal apical pleural scarring bilaterally. Central bronchial wall thickening is noted bilaterally and most pronounced in the lower lobes. Nodular opacities are noted along the major fissure on the right measuring up to 7 mm, axial image 83. There is a 4 mm nodule in the right middle lobe, axial image 78, unchanged from 2010. Atelectasis is noted bilaterally. No effusion or pneumothorax. Upper Abdomen: Cysts are noted in the right upper quadrant, likely renal in origin. No acute abnormality. Musculoskeletal: Degenerative changes in the thoracic spine. No acute osseous abnormality. Review of the MIP images confirms the above findings. IMPRESSION: 1.  No evidence of pulmonary embolism. 2. Bronchial wall thickening bilaterally which may be infectious or inflammatory. 3. Nodular densities along the major fissure on the right measuring up to 7 mm. Non-contrast chest CT at 6-12 months is recommended. If the nodule is stable at time of repeat CT, then future CT at 18-24 months (from today's scan) is considered optional for low-risk patients, but is recommended for high-risk patients. This recommendation follows the consensus statement: Guidelines for Management of Incidental Pulmonary Nodules Detected on CT Images: From the Fleischner Society 2017; Radiology 2017; 284:228-243. 4. Coronary artery calcifications. 5. Aortic atherosclerosis. Electronically Signed   By: Brett Fairy M.D.   On: 02/20/2022 21:04   DG Chest Port 1 View  Result Date: 02/20/2022 CLINICAL DATA:  Chest pain shortness of breath EXAM: PORTABLE CHEST 1 VIEW COMPARISON:  06/22/2014 FINDINGS: The heart size and mediastinal contours are  within normal limits. Both lungs are clear. The visualized skeletal structures are unremarkable. IMPRESSION: No active disease. Electronically Signed   By: Donavan Foil M.D.   On: 02/20/2022 19:24    Procedures Procedures    Medications Ordered in ED Medications  lactated ringers bolus 1,000 mL (0 mLs Intravenous Stopped 02/20/22 2100)  iohexol (OMNIPAQUE) 350 MG/ML injection 60 mL (60 mLs Intravenous Contrast Given 02/20/22 2035)    ED Course/ Medical Decision Making/ A&P Clinical Course as of 02/20/22 2251  Wed Feb 20, 2022  1908 This is a 71 year old male presenting with a chief complaint of episode of dizziness.  He was working out in the yard working hard throughout the day today when he had a sudden onset episode of lightheadedness and dizziness.  When he came inside, his wife noticed that he appeared short of breath and called EMS.  He has history of hypertension, hyperlipidemia.  He denies any chest pain but does endorse shortness of breath earlier in the day.  He denies fevers or chills nausea vomiting, syncope or shortness of breath prior to this. He is otherwise asymptomatic.  Was brought in by EMS with concern for elevated heart rate in transfer. Planning for broad screening evaluation including CBC, CMP, troponin including delta troponin and CT angiography to evaluate for thoracic aortic aneurysm versus pulmonary embolism.  Initial EKG grossly reassuring. If all results negative, patient can be considered safe candidate for outpatient continued care with cardiology with close cardiac referral.  If symptoms return or concerning findings on objective evaluation, patient may require inpatient care and management. [CC]  2250 CT Angio Chest PE W and/or Wo Contrast [CR]    Clinical Course User Index [CC] Tretha Sciara, MD [CR] Wilnette Kales, [place]                           Medical Decision Making Amount and/or Complexity of Data Reviewed Labs: ordered. Radiology:  ordered.  Risk Prescription drug management.   This patient presents to the ED for concern of tachycardia, this involves an extensive number of treatment options, and is a complaint that carries with it a high risk of complications and morbidity.  The differential diagnosis includes ACS, pulmonary embolism, aortic dissection, arrhythmia, dehydration, ethanol ingestion   Co morbidities that complicate the patient evaluation  See HPI   Additional history obtained:  Additional history obtained from EMR   Lab Tests:  I Ordered, and personally interpreted labs.  The pertinent results include: Mild leukocytosis of 11.3.  No evidence of anemia.  Platelets within normal range.  Chloride slightly elevated of 114 with bicarb decreased to 19.  Patient was given 1 L of lactated Ringer's.  Slight decrease in renal function with a GFR of 34, creatinine 2.02 and BUN of 22.  BNP of 103.9.  Patient not clinically volume overloaded with CT and chest x-ray imaging negative for pulmonary vascular congestion/atelectasis.  Initial troponin 9 with second 10; EKG insignificant for myocardial infarction   Imaging Studies ordered:  I ordered imaging studies including chest x-ray, CT angio chest I independently visualized and interpreted imaging which showed  Chest x-ray: No acute cardiopulmonary process. CT angio chest: Bronchial wall thickening with bilaterally which may be infectious or inflammatory.  Nodular densities at the major fissure on the right lung measuring up to 7 mm.  Coronary artery calcifications.  Aortic atherosclerosis. I agree with the radiologist interpretation  Cardiac Monitoring: / EKG:  The patient was maintained on a cardiac monitor.  I personally viewed and interpreted the cardiac monitored which showed an underlying rhythm of: Sinus tachycardia with nonspecific ST-T wave changes.   Consultations Obtained:  N/a    Problem List / ED Course / Critical interventions / Medication  management  SVT I ordered medication including 1 L lactated Ringer's for rehydration Reevaluation of the patient after these medicines showed that the patient resolved I have reviewed the patients home medicines and have made adjustments as needed   Social Determinants of Health:  Former cigarette use quit 1999.  Denies illicit drug use.   Test / Admission - Considered:  Vitals signs significant for initial tachycardia with a rate of 1 50-1 60.  Rate decreased within normal range with administration of 1 L of lactated ringer.. Otherwise within normal range and stable throughout visit. Laboratory/imaging studies significant for: See above Patient symptoms of tachycardia likely secondary to dehydration given resolution of symptoms with administration of normal saline as well as lack of findings on laboratory studies ordered.  Patient advised proper oral hydration as well as close follow-up outpatient with cardiology.  Ambulatory referral for cardiology placed for prompt follow-up.  Treatment plan was discussed at length with patient and family and they acknowledge understanding were agreeable to said plan. Worrisome signs and symptoms were discussed with the patient, and the patient acknowledged understanding to return to the ED if noticed. Patient was stable upon discharge.          Final Clinical Impression(s) / ED Diagnoses Final diagnoses:  SVT (supraventricular tachycardia) ([place])    Rx / DC Orders ED Discharge Orders          Ordered    Ambulatory referral to Cardiology       Comments: If you have not heard from the Cardiology office within the next 72 hours please call [phone].   02/20/22 [place], [place], [place] 02/20/22 2230    Tretha Sciara, MD 02/20/22 2347

## 2022-02-27 ENCOUNTER — Other Ambulatory Visit (HOSPITAL_BASED_OUTPATIENT_CLINIC_OR_DEPARTMENT_OTHER): Payer: Self-pay

## 2022-02-27 DIAGNOSIS — J40 Bronchitis, not specified as acute or chronic: Secondary | ICD-10-CM | POA: Diagnosis not present

## 2022-02-27 DIAGNOSIS — R059 Cough, unspecified: Secondary | ICD-10-CM | POA: Diagnosis not present

## 2022-02-27 MED ORDER — AREXVY 120 MCG/0.5ML IM SUSR
INTRAMUSCULAR | 0 refills | Status: DC
  Filled 2022-02-27: qty 0.5, 1d supply, fill #0

## 2022-02-28 ENCOUNTER — Other Ambulatory Visit (HOSPITAL_BASED_OUTPATIENT_CLINIC_OR_DEPARTMENT_OTHER): Payer: Self-pay

## 2022-03-01 DIAGNOSIS — J40 Bronchitis, not specified as acute or chronic: Secondary | ICD-10-CM | POA: Diagnosis not present

## 2022-03-01 DIAGNOSIS — T887XXA Unspecified adverse effect of drug or medicament, initial encounter: Secondary | ICD-10-CM | POA: Diagnosis not present

## 2022-03-01 DIAGNOSIS — T364X5A Adverse effect of tetracyclines, initial encounter: Secondary | ICD-10-CM | POA: Diagnosis not present

## 2022-03-06 ENCOUNTER — Encounter: Payer: Self-pay | Admitting: Internal Medicine

## 2022-03-06 ENCOUNTER — Ambulatory Visit: Payer: Medicare Other | Admitting: Internal Medicine

## 2022-03-06 VITALS — BP 141/80 | HR 62 | Temp 98.0°F | Resp 16 | Ht 72.0 in | Wt 220.0 lb

## 2022-03-06 DIAGNOSIS — R0609 Other forms of dyspnea: Secondary | ICD-10-CM | POA: Diagnosis not present

## 2022-03-06 DIAGNOSIS — R002 Palpitations: Secondary | ICD-10-CM | POA: Diagnosis not present

## 2022-03-06 DIAGNOSIS — R9431 Abnormal electrocardiogram [ECG] [EKG]: Secondary | ICD-10-CM | POA: Insufficient documentation

## 2022-03-06 DIAGNOSIS — E785 Hyperlipidemia, unspecified: Secondary | ICD-10-CM | POA: Diagnosis not present

## 2022-03-06 DIAGNOSIS — I10 Essential (primary) hypertension: Secondary | ICD-10-CM | POA: Diagnosis not present

## 2022-03-06 DIAGNOSIS — E782 Mixed hyperlipidemia: Secondary | ICD-10-CM | POA: Insufficient documentation

## 2022-03-06 MED ORDER — ATORVASTATIN CALCIUM 40 MG PO TABS: 40.0000 mg | ORAL_TABLET | Freq: Every day | ORAL | 2 refills | Status: DC

## 2022-03-06 NOTE — Progress Notes (Signed)
Primary Physician/Referring:  Kathalene Frames, MD  Patient ID: Billy Fields, male    DOB: March 06, 1951, 71 y.o.   MRN: [id number]  No chief complaint on file.  HPI:    Billy Fields  is a 71 y.o. male with past medical history significant for HTN, HLD, and new onset DOE who is here to establish care with cardiology. He has been having chest pain, shortness of breath, and palpitations with activity. This has been occurring more frequently now when he is golfing or walking up hill. Patient has not had a stress test in many years and he does not believe he's had an echo in the past. He did go to the hospital for racing heart rates recently but EKG in hospital was within normal limits. He had CT chest PE protocol which was negative for pulmonary embolism. At rest, he denies chest pain, shortness of breath, diaphoresis, syncope.  Past Medical History:  Diagnosis Date  . Arthritis   . GERD (gastroesophageal reflux disease)   . Hyperlipidemia   . Hypertension    Past Surgical History:  Procedure Laterality Date  . COLONOSCOPY    . KNEE ARTHROSCOPY Left   . TOE SURGERY Right   . TONSILLECTOMY    . TOTAL KNEE ARTHROPLASTY Left 07/05/2014   Procedure: TOTAL KNEE ARTHROPLASTY;  Surgeon: Meredith Pel, MD;  Location: [facility];  Service: Orthopedics;  Laterality: Left;  . TOTAL KNEE ARTHROPLASTY Right 02/07/2020   Procedure: RIGHT TOTAL KNEE ARTHROPLASTY;  Surgeon: Meredith Pel, MD;  Location: [facility];  Service: Orthopedics;  Laterality: Right;  Marland Kitchen VASECTOMY    . VASECTOMY REVERSAL     Family History  Problem Relation Age of Onset  . Heart attack Mother   . Cirrhosis Father   . CAD Father     Social History   Tobacco Use  . Smoking status: Former    Types: Cigarettes    Quit date: 03/27/1998    Years since quitting: 23.9  . Smokeless tobacco: Never  Substance Use Topics  . Alcohol use: No   Marital Status: Married  ROS  Review of Systems  Cardiovascular:  Positive  for chest pain, dyspnea on exertion, irregular heartbeat and palpitations.  All other systems reviewed and are negative. Objective  Blood pressure (!) 141/80, pulse 62, temperature 98 F (36.7 C), temperature source Temporal, resp. rate 16, height 6' (1.829 m), weight 220 lb (99.8 kg), SpO2 98 %. Body mass index is 29.84 kg/m.     03/06/2022   10:25 AM 02/20/2022   10:45 PM 02/20/2022   10:30 PM  Vitals with BMI  Height '6\' 0"'$     Weight 220 lbs    BMI 73.41    Systolic 937 902   Diastolic 80 81   Pulse 62 74 74     Physical Exam Vitals and nursing note reviewed.  Constitutional:      Appearance: Normal appearance.  HENT:     Head: Normocephalic and atraumatic.  Neck:     Vascular: No carotid bruit.  Cardiovascular:     Rate and Rhythm: Normal rate and regular rhythm.     Pulses: Normal pulses.     Heart sounds: Normal heart sounds. No murmur heard.    No gallop.  Pulmonary:     Effort: Pulmonary effort is normal.     Breath sounds: Normal breath sounds.  Abdominal:     General: Bowel sounds are normal.     Palpations: Abdomen is  soft.  Musculoskeletal:     Right lower leg: No edema.     Left lower leg: No edema.  Skin:    General: Skin is warm and dry.  Neurological:     Mental Status: He is alert.   Medications and allergies   Allergies  Allergen Reactions  . Amlodipine Besylate     Other reaction(s): side and kidney pain  . Doxycycline      Medication list after today's encounter   Current Outpatient Medications:  .  amoxicillin (AMOXIL) 500 MG tablet, 2 grams 1 hour prior to dental procedure, Disp: 8 tablet, Rfl: 0 .  aspirin 81 MG chewable tablet, Chew 1 tablet (81 mg total) by mouth daily., Disp: 90 tablet, Rfl: 0 .  atorvastatin (LIPITOR) 40 MG tablet, Take 1 tablet (40 mg total) by mouth at bedtime., Disp: 30 tablet, Rfl: 2 .  Cholecalciferol (VITAMIN D3) 1.25 MG (50000 UT) CAPS, Take by mouth., Disp: , Rfl:  .  dextromethorphan-guaiFENesin (MUCINEX  DM) 30-600 MG 12hr tablet, Take 1 tablet by mouth 2 (two) times daily., Disp: , Rfl:  .  losartan (COZAAR) 100 MG tablet, Take 100 mg by mouth daily. , Disp: , Rfl:  .  Multiple Vitamin (MULTIVITAMIN WITH MINERALS) TABS tablet, Take 1 tablet by mouth daily., Disp: , Rfl:  .  terazosin (HYTRIN) 2 MG capsule, Take 2 mg by mouth at bedtime., Disp: , Rfl:   Laboratory examination:   Lab Results  Component Value Date   NA 144 02/20/2022   K 4.2 02/20/2022   CO2 19 (L) 02/20/2022   GLUCOSE 91 02/20/2022   BUN 22 02/20/2022   CREATININE 2.02 (H) 02/20/2022   CALCIUM 9.4 02/20/2022   GFRNONAA 34 (L) 02/20/2022       Latest Ref Rng & Units 02/20/2022    7:25 PM 02/02/2020    8:27 AM 07/06/2014    5:45 AM  CMP  Glucose 70 - 99 mg/dL 91  100  110   BUN 8 - 23 mg/dL '22  18  22   '$ Creatinine 0.61 - 1.24 mg/dL 2.02  1.31  1.81   Sodium 135 - 145 mmol/L 144  141  134   Potassium 3.5 - 5.1 mmol/L 4.2  4.5  3.8   Chloride 98 - 111 mmol/L 114  106  102   CO2 22 - 32 mmol/L '19  26  27   '$ Calcium 8.9 - 10.3 mg/dL 9.4  10.0  8.4   Total Protein 6.5 - 8.1 g/dL 6.1     Total Bilirubin 0.3 - 1.2 mg/dL 1.4     Alkaline Phos 38 - 126 U/L 76     AST 15 - 41 U/L 23     ALT 0 - 44 U/L 17         Latest Ref Rng & Units 02/20/2022    7:25 PM 02/02/2020    8:27 AM 07/08/2014    5:35 AM  CBC  WBC 4.0 - 10.5 K/uL 11.3  6.2  8.5   Hemoglobin 13.0 - 17.0 g/dL 14.9  15.6  11.0   Hematocrit 39.0 - 52.0 % 44.0  48.3  32.1   Platelets 150 - 400 K/uL 159  190  157     Lipid Panel No results for input(s): "CHOL", "TRIG", "LDLCALC", "VLDL", "HDL", "CHOLHDL", "LDLDIRECT" in the last 8760 hours.  HEMOGLOBIN A1C No results found for: "HGBA1C", "MPG" TSH No results for input(s): "TSH" in the last 8760 hours.  External  labs:     Radiology:    Cardiac Studies:   No results found for this or any previous visit from the past 1095 days.     No results found for this or any previous visit from the past  1095 days.    EKG:   03/06/22 Sinus Rhythm -occasional PAC   Assessment     ICD-10-CM   1. Nonspecific abnormal electrocardiogram (ECG) (EKG)  R94.31 PCV ECHOCARDIOGRAM COMPLETE    PCV MYOCARDIAL PERFUSION WO LEXISCAN    2. Palpitations  R00.2 EKG 12-Lead    PCV ECHOCARDIOGRAM COMPLETE    PCV MYOCARDIAL PERFUSION WO LEXISCAN    3. DOE (dyspnea on exertion)  R06.09     4. Essential hypertension  I10     5. Hyperlipidemia LDL goal <70  E78.5        Orders Placed This Encounter  Procedures  . PCV MYOCARDIAL PERFUSION WO LEXISCAN    Standing Status:   Future    Standing Expiration Date:   05/06/2022  . EKG 12-Lead  . PCV ECHOCARDIOGRAM COMPLETE    Standing Status:   Future    Standing Expiration Date:   03/07/2023    Meds ordered this encounter  Medications  . atorvastatin (LIPITOR) 40 MG tablet    Sig: Take 1 tablet (40 mg total) by mouth at bedtime.    Dispense:  30 tablet    Refill:  2    Medications Discontinued During This Encounter  Medication Reason  . COVID-19 mRNA vaccine, Moderna, 100 MCG/0.5ML injection   . methocarbamol (ROBAXIN) 500 MG tablet   . oxyCODONE (OXY IR/ROXICODONE) 5 MG immediate release tablet   . RSV vaccine recomb adjuvanted (AREXVY) 120 MCG/0.5ML injection   . atorvastatin (LIPITOR) 10 MG tablet      Recommendations:   SUEDE GREENAWALT is a 71 y.o.  male with HTN, HLD, and new onset DOE  Continue current cardiac medications. Increase lipitor to '40mg'$  every night Encourage low-sodium diet, less than 2000 mg daily. Schedule imaging tests in office - echo and stress test. Follow-up in 1-2 months or sooner if needed.     Floydene Flock, DO, [facility]  03/06/2022, 10:58 AM Office: [phone] Pager: [phone]

## 2022-03-13 ENCOUNTER — Other Ambulatory Visit (HOSPITAL_BASED_OUTPATIENT_CLINIC_OR_DEPARTMENT_OTHER): Payer: Self-pay

## 2022-03-13 DIAGNOSIS — Z23 Encounter for immunization: Secondary | ICD-10-CM | POA: Diagnosis not present

## 2022-03-13 MED ORDER — FLUAD QUADRIVALENT 0.5 ML IM PRSY
PREFILLED_SYRINGE | INTRAMUSCULAR | 0 refills | Status: AC
  Filled 2022-03-13: qty 0.5, 1d supply, fill #0

## 2022-03-14 ENCOUNTER — Other Ambulatory Visit (HOSPITAL_BASED_OUTPATIENT_CLINIC_OR_DEPARTMENT_OTHER): Payer: Self-pay

## 2022-03-14 ENCOUNTER — Ambulatory Visit: Payer: Medicare Other

## 2022-03-14 DIAGNOSIS — R9431 Abnormal electrocardiogram [ECG] [EKG]: Secondary | ICD-10-CM | POA: Diagnosis not present

## 2022-03-14 DIAGNOSIS — R002 Palpitations: Secondary | ICD-10-CM

## 2022-03-20 ENCOUNTER — Other Ambulatory Visit: Payer: Medicare Other

## 2022-03-25 ENCOUNTER — Ambulatory Visit: Payer: Medicare Other

## 2022-03-25 DIAGNOSIS — R9431 Abnormal electrocardiogram [ECG] [EKG]: Secondary | ICD-10-CM | POA: Diagnosis not present

## 2022-03-25 DIAGNOSIS — R002 Palpitations: Secondary | ICD-10-CM

## 2022-03-27 DIAGNOSIS — Z23 Encounter for immunization: Secondary | ICD-10-CM | POA: Diagnosis not present

## 2022-03-27 DIAGNOSIS — Z125 Encounter for screening for malignant neoplasm of prostate: Secondary | ICD-10-CM | POA: Diagnosis not present

## 2022-03-27 DIAGNOSIS — I10 Essential (primary) hypertension: Secondary | ICD-10-CM | POA: Diagnosis not present

## 2022-03-27 DIAGNOSIS — R911 Solitary pulmonary nodule: Secondary | ICD-10-CM | POA: Diagnosis not present

## 2022-03-27 DIAGNOSIS — R739 Hyperglycemia, unspecified: Secondary | ICD-10-CM | POA: Diagnosis not present

## 2022-03-27 DIAGNOSIS — T887XXA Unspecified adverse effect of drug or medicament, initial encounter: Secondary | ICD-10-CM | POA: Diagnosis not present

## 2022-03-27 DIAGNOSIS — E782 Mixed hyperlipidemia: Secondary | ICD-10-CM | POA: Diagnosis not present

## 2022-03-27 DIAGNOSIS — Z87891 Personal history of nicotine dependence: Secondary | ICD-10-CM | POA: Diagnosis not present

## 2022-03-27 DIAGNOSIS — N1831 Chronic kidney disease, stage 3a: Secondary | ICD-10-CM | POA: Diagnosis not present

## 2022-03-27 DIAGNOSIS — Z0000 Encounter for general adult medical examination without abnormal findings: Secondary | ICD-10-CM | POA: Diagnosis not present

## 2022-04-17 ENCOUNTER — Encounter: Payer: Self-pay | Admitting: Internal Medicine

## 2022-04-17 ENCOUNTER — Ambulatory Visit: Payer: Medicare Other | Admitting: Internal Medicine

## 2022-04-17 VITALS — BP 146/82 | HR 70 | Temp 97.8°F | Ht 72.0 in | Wt 227.0 lb

## 2022-04-17 DIAGNOSIS — I10 Essential (primary) hypertension: Secondary | ICD-10-CM | POA: Diagnosis not present

## 2022-04-17 DIAGNOSIS — R0609 Other forms of dyspnea: Secondary | ICD-10-CM | POA: Diagnosis not present

## 2022-04-17 DIAGNOSIS — E785 Hyperlipidemia, unspecified: Secondary | ICD-10-CM | POA: Diagnosis not present

## 2022-04-17 NOTE — Progress Notes (Signed)
Primary Physician/Referring:  Kathalene Frames, MD  Patient ID: Lolita Lenz, male    DOB: 02-26-1951, 71 y.o.   MRN: [id number]  Chief Complaint  Patient presents with   Abnormal ECG   Results         HPI:    Billy Fields  is a 71 y.o. male with past medical history significant for HTN and HLD who is here for a follow-up visit. He has been doing well since last time he was here. His BP is slightly elevated but he states at home it is <130/80 and his wife is a retired Marine scientist. He still has some DOE when walking up steep hills but no other symptoms.  Patient denies chest pain, palpitations, diaphoresis, syncope, edema, orthopnea, claudication, PND.  Patient is agreeable to following up annually.  He will reach out and come back sooner if symptoms recur or become worse.  Past Medical History:  Diagnosis Date   Arthritis    GERD (gastroesophageal reflux disease)    Hyperlipidemia    Hypertension    Past Surgical History:  Procedure Laterality Date   COLONOSCOPY     KNEE ARTHROSCOPY Left    TOE SURGERY Right    TONSILLECTOMY     TOTAL KNEE ARTHROPLASTY Left 07/05/2014   Procedure: TOTAL KNEE ARTHROPLASTY;  Surgeon: Meredith Pel, MD;  Location: [place];  Service: Orthopedics;  Laterality: Left;   TOTAL KNEE ARTHROPLASTY Right 02/07/2020   Procedure: RIGHT TOTAL KNEE ARTHROPLASTY;  Surgeon: Meredith Pel, MD;  Location: [facility];  Service: Orthopedics;  Laterality: Right;   VASECTOMY     VASECTOMY REVERSAL     Family History  Problem Relation Age of Onset   Heart attack Mother    Cirrhosis Father    CAD Father     Social History   Tobacco Use   Smoking status: Former    Types: Cigarettes    Quit date: 03/27/1998    Years since quitting: 24.0   Smokeless tobacco: Never  Substance Use Topics   Alcohol use: No   Marital Status: Married  ROS  Review of Systems  Cardiovascular:  Positive for dyspnea on exertion. Negative for chest pain, irregular heartbeat  and palpitations.  All other systems reviewed and are negative.  Objective  Blood pressure (!) 146/82, pulse 70, temperature 97.8 F (36.6 C), height 6' (1.829 m), weight 227 lb (103 kg), SpO2 98 %. Body mass index is 30.79 kg/m.     04/17/2022    9:51 AM 03/06/2022   10:25 AM 02/20/2022   10:45 PM  Vitals with BMI  Height '6\' 0"'$  '6\' 0"'$    Weight 227 lbs 220 lbs   BMI 74.08 14.48   Systolic 185 631 497  Diastolic 82 80 81  Pulse 70 62 74     Physical Exam Vitals and nursing note reviewed.  Constitutional:      Appearance: Normal appearance.  HENT:     Head: Normocephalic and atraumatic.  Neck:     Vascular: No carotid bruit.  Cardiovascular:     Rate and Rhythm: Normal rate and regular rhythm.     Pulses: Normal pulses.     Heart sounds: Normal heart sounds. No murmur heard.    No gallop.  Pulmonary:     Effort: Pulmonary effort is normal.     Breath sounds: Normal breath sounds.  Abdominal:     General: Bowel sounds are normal.     Palpations: Abdomen is soft.  Musculoskeletal:     Right lower leg: No edema.     Left lower leg: No edema.  Skin:    General: Skin is warm and dry.  Neurological:     Mental Status: He is alert.    Medications and allergies   Allergies  Allergen Reactions   Amlodipine Besylate     Other reaction(s): side and kidney pain   Doxycycline      Medication list after today's encounter   Current Outpatient Medications:    amoxicillin (AMOXIL) 500 MG tablet, 2 grams 1 hour prior to dental procedure, Disp: 8 tablet, Rfl: 0   aspirin 81 MG chewable tablet, Chew 1 tablet (81 mg total) by mouth daily., Disp: 90 tablet, Rfl: 0   atorvastatin (LIPITOR) 40 MG tablet, Take 1 tablet (40 mg total) by mouth at bedtime., Disp: 30 tablet, Rfl: 2   Cholecalciferol (VITAMIN D3) 1.25 MG (50000 UT) CAPS, Take 1,000 Units by mouth daily., Disp: , Rfl:    dextromethorphan-guaiFENesin (MUCINEX DM) 30-600 MG 12hr tablet, Take 1 tablet by mouth 2 (two) times  daily., Disp: , Rfl:    diclofenac Sodium (VOLTAREN) 1 % GEL, Apply topically as needed., Disp: , Rfl:    losartan (COZAAR) 100 MG tablet, Take 100 mg by mouth daily. , Disp: , Rfl:    Multiple Vitamin (MULTIVITAMIN WITH MINERALS) TABS tablet, Take 1 tablet by mouth daily., Disp: , Rfl:    terazosin (HYTRIN) 2 MG capsule, Take 2 mg by mouth at bedtime., Disp: , Rfl:    influenza vaccine adjuvanted (FLUAD QUADRIVALENT) 0.5 ML injection, Inject into the muscle., Disp: 0.5 mL, Rfl: 0  Laboratory examination:   Lab Results  Component Value Date   NA 144 02/20/2022   K 4.2 02/20/2022   CO2 19 (L) 02/20/2022   GLUCOSE 91 02/20/2022   BUN 22 02/20/2022   CREATININE 2.02 (H) 02/20/2022   CALCIUM 9.4 02/20/2022   GFRNONAA 34 (L) 02/20/2022       Latest Ref Rng & Units 02/20/2022    7:25 PM 02/02/2020    8:27 AM 07/06/2014    5:45 AM  CMP  Glucose 70 - 99 mg/dL 91  100  110   BUN 8 - 23 mg/dL '22  18  22   '$ Creatinine 0.61 - 1.24 mg/dL 2.02  1.31  1.81   Sodium 135 - 145 mmol/L 144  141  134   Potassium 3.5 - 5.1 mmol/L 4.2  4.5  3.8   Chloride 98 - 111 mmol/L 114  106  102   CO2 22 - 32 mmol/L '19  26  27   '$ Calcium 8.9 - 10.3 mg/dL 9.4  10.0  8.4   Total Protein 6.5 - 8.1 g/dL 6.1     Total Bilirubin 0.3 - 1.2 mg/dL 1.4     Alkaline Phos 38 - 126 U/L 76     AST 15 - 41 U/L 23     ALT 0 - 44 U/L 17         Latest Ref Rng & Units 02/20/2022    7:25 PM 02/02/2020    8:27 AM 07/08/2014    5:35 AM  CBC  WBC 4.0 - 10.5 K/uL 11.3  6.2  8.5   Hemoglobin 13.0 - 17.0 g/dL 14.9  15.6  11.0   Hematocrit 39.0 - 52.0 % 44.0  48.3  32.1   Platelets 150 - 400 K/uL 159  190  157     Lipid Panel No  results for input(s): "CHOL", "TRIG", "[place]", "VLDL", "HDL", "CHOLHDL", "LDLDIRECT" in the last 8760 hours.  HEMOGLOBIN A1C No results found for: "HGBA1C", "MPG" TSH No results for input(s): "TSH" in the last 8760 hours.  External labs:     Radiology:    Cardiac Studies:   Exercise  Tetrofosmin stress test 03/25/2022: Exercise nuclear stress test was performed using Bruce protocol. Patient reached 7.4 METS, and 92% of age predicted maximum heart rate. Exercise capacity was good. No chest pain reported. Heart rate and hemodynamic response were normal. Stress EKG revealed no ischemic changes. Normal myocardial perfusion. Stress LVEF 52%. Low risk study.   Echocardiogram 03/14/2022:  Normal LV systolic function with visual EF 55-60%. Left ventricle cavity  is normal in size. Normal left ventricular wall thickness. Normal global  wall motion. Normal diastolic filling pattern, normal LAP.  Mild (Grade I) aortic regurgitation.  Mild tricuspid regurgitation. No evidence of pulmonary hypertension. RVSP  measures 32 mmHg.  No prior study for comparison.     EKG:   03/06/22 Sinus Rhythm -occasional PAC   Assessment     ICD-10-CM   1. Essential hypertension  I10     2. Hyperlipidemia LDL goal <70  E78.5     3. DOE (dyspnea on exertion)  R06.09        No orders of the defined types were placed in this encounter.   No orders of the defined types were placed in this encounter.   There are no discontinued medications.    Recommendations:   BRAILEN MACNEAL is a 71 y.o.  male with HTN and HLD   Essential hypertension Continue current cardiac medications. Encourage low-sodium diet, less than 2000 mg daily.   Hyperlipidemia LDL goal <70 Continue lipitor '40mg'$  every night   DOE (dyspnea on exertion) Echo and stress test within normal limits   Follow-up in 12 months or sooner if needed.     Floydene Flock, DO, [facility]  04/17/2022, 2:12 PM Office: [phone] Pager: [phone]

## 2022-04-23 ENCOUNTER — Other Ambulatory Visit (HOSPITAL_BASED_OUTPATIENT_CLINIC_OR_DEPARTMENT_OTHER): Payer: Self-pay

## 2022-04-23 DIAGNOSIS — Z23 Encounter for immunization: Secondary | ICD-10-CM | POA: Diagnosis not present

## 2022-04-23 MED ORDER — COMIRNATY 30 MCG/0.3ML IM SUSY
PREFILLED_SYRINGE | INTRAMUSCULAR | 0 refills | Status: AC
  Filled 2022-04-23: qty 0.3, 1d supply, fill #0

## 2022-04-24 ENCOUNTER — Other Ambulatory Visit (HOSPITAL_COMMUNITY): Payer: Self-pay

## 2022-04-24 MED ORDER — EZETIMIBE 10 MG PO TABS
10.0000 mg | ORAL_TABLET | Freq: Every day | ORAL | 4 refills | Status: DC
  Filled 2022-04-24: qty 90, 90d supply, fill #0

## 2022-05-01 DIAGNOSIS — N1831 Chronic kidney disease, stage 3a: Secondary | ICD-10-CM | POA: Diagnosis not present

## 2022-06-01 ENCOUNTER — Other Ambulatory Visit: Payer: Self-pay | Admitting: Internal Medicine

## 2022-07-12 ENCOUNTER — Other Ambulatory Visit (HOSPITAL_BASED_OUTPATIENT_CLINIC_OR_DEPARTMENT_OTHER): Payer: Self-pay

## 2022-08-05 DIAGNOSIS — D1801 Hemangioma of skin and subcutaneous tissue: Secondary | ICD-10-CM | POA: Diagnosis not present

## 2022-08-05 DIAGNOSIS — D225 Melanocytic nevi of trunk: Secondary | ICD-10-CM | POA: Diagnosis not present

## 2022-08-05 DIAGNOSIS — L821 Other seborrheic keratosis: Secondary | ICD-10-CM | POA: Diagnosis not present

## 2022-08-05 DIAGNOSIS — Z85828 Personal history of other malignant neoplasm of skin: Secondary | ICD-10-CM | POA: Diagnosis not present

## 2022-08-05 DIAGNOSIS — L812 Freckles: Secondary | ICD-10-CM | POA: Diagnosis not present

## 2022-08-05 DIAGNOSIS — L570 Actinic keratosis: Secondary | ICD-10-CM | POA: Diagnosis not present

## 2022-08-05 DIAGNOSIS — Z85820 Personal history of malignant melanoma of skin: Secondary | ICD-10-CM | POA: Diagnosis not present

## 2022-08-26 ENCOUNTER — Other Ambulatory Visit: Payer: Self-pay | Admitting: Internal Medicine

## 2022-08-26 DIAGNOSIS — R911 Solitary pulmonary nodule: Secondary | ICD-10-CM

## 2022-09-04 ENCOUNTER — Ambulatory Visit
Admission: RE | Admit: 2022-09-04 | Discharge: 2022-09-04 | Disposition: A | Discharge status: Home / Self Care | Payer: Medicare Other | Source: Ambulatory Visit | Attending: Internal Medicine | Admitting: Internal Medicine

## 2022-09-04 DIAGNOSIS — J439 Emphysema, unspecified: Secondary | ICD-10-CM | POA: Diagnosis not present

## 2022-09-04 DIAGNOSIS — R911 Solitary pulmonary nodule: Secondary | ICD-10-CM | POA: Diagnosis not present

## 2022-09-04 DIAGNOSIS — R918 Other nonspecific abnormal finding of lung field: Secondary | ICD-10-CM | POA: Diagnosis not present

## 2022-09-04 DIAGNOSIS — I700 Atherosclerosis of aorta: Secondary | ICD-10-CM | POA: Diagnosis not present

## 2022-09-20 ENCOUNTER — Other Ambulatory Visit: Payer: Medicare Other

## 2022-10-02 ENCOUNTER — Other Ambulatory Visit (HOSPITAL_COMMUNITY): Payer: Self-pay

## 2022-12-17 DIAGNOSIS — H35372 Puckering of macula, left eye: Secondary | ICD-10-CM | POA: Diagnosis not present

## 2022-12-17 DIAGNOSIS — H52203 Unspecified astigmatism, bilateral: Secondary | ICD-10-CM | POA: Diagnosis not present

## 2022-12-17 DIAGNOSIS — H353131 Nonexudative age-related macular degeneration, bilateral, early dry stage: Secondary | ICD-10-CM | POA: Diagnosis not present

## 2022-12-17 DIAGNOSIS — D3131 Benign neoplasm of right choroid: Secondary | ICD-10-CM | POA: Diagnosis not present

## 2022-12-17 DIAGNOSIS — Z961 Presence of intraocular lens: Secondary | ICD-10-CM | POA: Diagnosis not present

## 2023-02-04 DIAGNOSIS — L820 Inflamed seborrheic keratosis: Secondary | ICD-10-CM | POA: Diagnosis not present

## 2023-02-04 DIAGNOSIS — L821 Other seborrheic keratosis: Secondary | ICD-10-CM | POA: Diagnosis not present

## 2023-02-04 DIAGNOSIS — B354 Tinea corporis: Secondary | ICD-10-CM | POA: Diagnosis not present

## 2023-02-04 DIAGNOSIS — Z85820 Personal history of malignant melanoma of skin: Secondary | ICD-10-CM | POA: Diagnosis not present

## 2023-02-04 DIAGNOSIS — D225 Melanocytic nevi of trunk: Secondary | ICD-10-CM | POA: Diagnosis not present

## 2023-02-04 DIAGNOSIS — Z85828 Personal history of other malignant neoplasm of skin: Secondary | ICD-10-CM | POA: Diagnosis not present

## 2023-02-04 DIAGNOSIS — L570 Actinic keratosis: Secondary | ICD-10-CM | POA: Diagnosis not present

## 2023-02-04 DIAGNOSIS — D2272 Melanocytic nevi of left lower limb, including hip: Secondary | ICD-10-CM | POA: Diagnosis not present

## 2023-02-04 DIAGNOSIS — L812 Freckles: Secondary | ICD-10-CM | POA: Diagnosis not present

## 2023-02-04 DIAGNOSIS — D1801 Hemangioma of skin and subcutaneous tissue: Secondary | ICD-10-CM | POA: Diagnosis not present

## 2023-03-06 ENCOUNTER — Other Ambulatory Visit (HOSPITAL_BASED_OUTPATIENT_CLINIC_OR_DEPARTMENT_OTHER): Payer: Self-pay

## 2023-03-06 DIAGNOSIS — Z23 Encounter for immunization: Secondary | ICD-10-CM | POA: Diagnosis not present

## 2023-03-06 MED ORDER — COMIRNATY 30 MCG/0.3ML IM SUSY
0.3000 mL | PREFILLED_SYRINGE | Freq: Once | INTRAMUSCULAR | 0 refills | Status: AC
  Filled 2023-03-06: qty 0.3, 1d supply, fill #0

## 2023-04-10 DIAGNOSIS — J069 Acute upper respiratory infection, unspecified: Secondary | ICD-10-CM | POA: Diagnosis not present

## 2023-04-16 ENCOUNTER — Ambulatory Visit: Payer: Medicare Other | Admitting: Internal Medicine

## 2023-04-16 ENCOUNTER — Ambulatory Visit: Payer: Self-pay | Admitting: Cardiology

## 2023-04-17 ENCOUNTER — Other Ambulatory Visit (HOSPITAL_BASED_OUTPATIENT_CLINIC_OR_DEPARTMENT_OTHER): Payer: Self-pay

## 2023-04-17 DIAGNOSIS — Z23 Encounter for immunization: Secondary | ICD-10-CM | POA: Diagnosis not present

## 2023-04-17 MED ORDER — FLUAD 0.5 ML IM SUSY
0.5000 mL | PREFILLED_SYRINGE | Freq: Once | INTRAMUSCULAR | 0 refills | Status: AC
  Filled 2023-04-17: qty 0.5, 1d supply, fill #0

## 2023-04-21 DIAGNOSIS — Z23 Encounter for immunization: Secondary | ICD-10-CM | POA: Diagnosis not present

## 2023-04-21 DIAGNOSIS — Z0000 Encounter for general adult medical examination without abnormal findings: Secondary | ICD-10-CM | POA: Diagnosis not present

## 2023-04-21 DIAGNOSIS — J432 Centrilobular emphysema: Secondary | ICD-10-CM | POA: Diagnosis not present

## 2023-04-21 DIAGNOSIS — E669 Obesity, unspecified: Secondary | ICD-10-CM | POA: Diagnosis not present

## 2023-04-21 DIAGNOSIS — Z79899 Other long term (current) drug therapy: Secondary | ICD-10-CM | POA: Diagnosis not present

## 2023-04-21 DIAGNOSIS — R739 Hyperglycemia, unspecified: Secondary | ICD-10-CM | POA: Diagnosis not present

## 2023-04-21 DIAGNOSIS — Z125 Encounter for screening for malignant neoplasm of prostate: Secondary | ICD-10-CM | POA: Diagnosis not present

## 2023-04-21 DIAGNOSIS — N1831 Chronic kidney disease, stage 3a: Secondary | ICD-10-CM | POA: Diagnosis not present

## 2023-04-21 DIAGNOSIS — I700 Atherosclerosis of aorta: Secondary | ICD-10-CM | POA: Diagnosis not present

## 2023-04-21 DIAGNOSIS — Z1331 Encounter for screening for depression: Secondary | ICD-10-CM | POA: Diagnosis not present

## 2023-04-21 DIAGNOSIS — I2510 Atherosclerotic heart disease of native coronary artery without angina pectoris: Secondary | ICD-10-CM | POA: Diagnosis not present

## 2023-04-21 DIAGNOSIS — Z87891 Personal history of nicotine dependence: Secondary | ICD-10-CM | POA: Diagnosis not present

## 2023-04-21 DIAGNOSIS — E782 Mixed hyperlipidemia: Secondary | ICD-10-CM | POA: Diagnosis not present

## 2023-04-21 DIAGNOSIS — I10 Essential (primary) hypertension: Secondary | ICD-10-CM | POA: Diagnosis not present

## 2023-04-21 DIAGNOSIS — R911 Solitary pulmonary nodule: Secondary | ICD-10-CM | POA: Diagnosis not present

## 2023-05-06 ENCOUNTER — Encounter: Payer: Self-pay | Admitting: Cardiology

## 2023-05-06 ENCOUNTER — Ambulatory Visit: Payer: Medicare Other | Attending: Internal Medicine | Admitting: Cardiology

## 2023-05-06 VITALS — BP 130/78 | HR 74 | Resp 16 | Ht 72.0 in | Wt 217.0 lb

## 2023-05-06 DIAGNOSIS — E782 Mixed hyperlipidemia: Secondary | ICD-10-CM

## 2023-05-06 DIAGNOSIS — I10 Essential (primary) hypertension: Secondary | ICD-10-CM | POA: Diagnosis not present

## 2023-05-06 NOTE — Patient Instructions (Signed)
Medication Instructions:   Your physician recommends that you continue on your current medications as directed. Please refer to the Current Medication list given to you today.  *If you need a refill on your cardiac medications before your next appointment, please call your pharmacy*    Follow-Up:  AS NEEDED WITH DR. PATWARDHAN

## 2023-05-06 NOTE — Progress Notes (Signed)
Cardiology Office Note:  .   Date:  05/06/2023  ID:  Billy Fields, DOB 1950/08/06, MRN [id number] PCP: Emilio Aspen, MD  [facility] Providers Cardiologist:  Truett Mainland, MD PCP: Emilio Aspen, MD  Chief Complaint  Patient presents with   Nonspecific abnormal electrocardiogram   Hypertension        Follow-up    1 year      History of Present Illness: Billy Fields is a 72 y.o. male with hypertension, hyperlipidemia  Patient is doing well, has no complaints today.  He stays active with regular gym workout, without any complaints of chest pain or shortness of breath.  Reviewed lipid panel from 03/2022.  Vitals:   05/06/23 1313  BP: 130/78  Pulse: 74  Resp: 16  SpO2: 96%     ROS:  Review of Systems  Cardiovascular:  Negative for chest pain, dyspnea on exertion, leg swelling, palpitations and syncope.     Studies Reviewed: Marland Kitchen       EKG 05/06/2023: Normal sinus rhythm Right superior axis deviation Incomplete right bundle branch block When compared with ECG of 20-Feb-2022 18:54,  Rate is slower    CT chest w/ contrast 09/04/2022: 1. Essentially stable pulmonary nodules measuring up to 7 mm. Non-contrast chest CT is recommended at 18-24 months (from today's scan) is considered optional for low-risk patients, but is recommended for high-risk patients. This recommendation follows the consensus statement: Guidelines for Management of Incidental Pulmonary Nodules Detected on CT Images: From the Fleischner Society 2017; Radiology 2017; 284:228-243. 2. Emphysema. 3. Coronary artery calcifications. 4. Coronary artery calcifications and aortic atherosclerosis. 5. Small hiatal hernia.   Exercise Tetrofosmin stress test 03/25/2022: Exercise nuclear stress test was performed using Bruce protocol. Patient reached 7.4 METS, and 92% of age predicted maximum heart rate. Exercise capacity was good. No chest pain reported. Heart rate and  hemodynamic response were normal. Stress EKG revealed no ischemic changes. Normal myocardial perfusion. Stress LVEF 52%. Low risk study.   Echocardiogram 03/14/2022: Normal LV systolic function with visual EF 55-60%. Left ventricle cavity is normal in size. Normal left ventricular wall thickness. Normal global wall motion. Normal diastolic filling pattern, normal LAP. Mild (Grade I) aortic regurgitation. Mild tricuspid regurgitation. No evidence of pulmonary hypertension. RVSP measures 32 mmHg. No prior study for comparison.   Physical Exam:   Physical Exam Vitals and nursing note reviewed.  Constitutional:      General: He is not in acute distress. Neck:     Vascular: No JVD.  Cardiovascular:     Rate and Rhythm: Normal rate and regular rhythm.     Heart sounds: Normal heart sounds. No murmur heard. Pulmonary:     Effort: Pulmonary effort is normal.     Breath sounds: Normal breath sounds. No wheezing or rales.  Musculoskeletal:     Right lower leg: No edema.     Left lower leg: No edema.      VISIT DIAGNOSES:   ICD-10-CM   1. Essential hypertension  I10 EKG 12-Lead    2. Mixed hyperlipidemia  E78.2        ASSESSMENT AND PLAN: .    Billy Fields is a 72 y.o. male with hypertension, hyperlipidemia  Hypertension: Well-controlled.  No change made today.  Mixed hyperlipidemia:  Well-controlled.  No changes made today. In absence of bleeding, reasonable to continue aspirin 80 mg daily given coronary and aortic atherosclerosis.  Continue follow-up with PCP. Follow-up with  me as needed.      Signed, Elder Negus, MD

## 2023-05-24 ENCOUNTER — Other Ambulatory Visit: Payer: Self-pay | Admitting: Cardiology

## 2023-05-28 ENCOUNTER — Other Ambulatory Visit: Payer: Self-pay

## 2023-05-28 MED ORDER — ATORVASTATIN CALCIUM 40 MG PO TABS: 40.0000 mg | ORAL_TABLET | Freq: Every day | ORAL | 3 refills | Status: AC

## 2023-05-28 NOTE — Telephone Encounter (Signed)
 RX sent to requested Pharmacy

## 2023-06-02 ENCOUNTER — Telehealth: Payer: Self-pay

## 2023-06-02 NOTE — Telephone Encounter (Signed)
Patient's wife called stating that patient hurt his left shoulder playing golf and would like for him to be seen this week by Dr. August Saucer.  Cb# [phone].  Please advise.  Thank you

## 2023-06-03 NOTE — Telephone Encounter (Signed)
Tried calling, no answer. LMVM advising have him worked in to [facility], Wednesday 130pm

## 2023-06-04 ENCOUNTER — Encounter: Payer: Self-pay | Admitting: Surgical

## 2023-06-04 ENCOUNTER — Ambulatory Visit (INDEPENDENT_AMBULATORY_CARE_PROVIDER_SITE_OTHER): Payer: Medicare Other | Admitting: Surgical

## 2023-06-04 DIAGNOSIS — S46209A Unspecified injury of muscle, fascia and tendon of other parts of biceps, unspecified arm, initial encounter: Secondary | ICD-10-CM | POA: Diagnosis not present

## 2023-06-04 MED ORDER — AMOXICILLIN 500 MG PO TABS: ORAL_TABLET | ORAL | 0 refills | Status: AC

## 2023-06-04 NOTE — Progress Notes (Signed)
Office Visit Note   Patient: Billy Fields           Date of Birth: 06-Feb-1951           MRN: [id number] Visit Date: 06/04/2023 Requested by: Emilio Aspen, MD [address] [place],  [place] [address] PCP: Emilio Aspen, MD  Subjective: Chief Complaint  Patient presents with   Left Shoulder - Pain    HPI: Billy Fields is a 72 y.o. male who presents to the office reporting left shoulder pain.  Patient has history of left shoulder rotator cuff tear repair with bicep tenodesis in early 2023.  He did well following that procedure and felt like his shoulder was doing well up until Monday.  He was playing golf and swung a driver, feeling a pop in the front of his shoulder.  This happened on 06/02/2023.  He states that his shoulder and bicep region were pretty sore on Monday to the point where he had to ice his proximal bicep and take Tylenol.  He had a little bit of trouble sleeping that night but pain is about 70% better over the last 2 days.  He has no new numbness or tingling.  Slept well last night without any difficulty.  Has no difficulty getting his arm overhead or any perceived weakness.  Has not noticed any bruising or swelling.              ROS: All systems reviewed are negative as they relate to the chief complaint within the history of present illness.  Patient denies fevers or chills.  Assessment & Plan: Visit Diagnoses:  1. Injury of tendon of biceps     Plan: Patient is a 72 year old male who returns to the clinic after initially doing well following left shoulder rotator cuff tear repair and bicep tenodesis in early 2023.  This was doing well up until 2 days ago when he swung a driver while playing golf and felt a pop in his bicep region proximally.  Has some soreness that day but overall he is about 70% improved in the last 2 days.  He has ultrasound examination demonstrating some hyperechoic tissue present in the bicipital groove proximally but  no significant tissue that is visible more distally in the groove with hypoechoic fluid noted in this region that tracks down to around the level of the proximal bicep muscle where patient localizes the majority of his pain.  Impression is long head of the bicep tendon rupture with no evidence of compromise of the rotator cuff repair.  Primarily has pain with supination on exam today.  We discussed options and recommended that patient return in 4 weeks for clinical recheck with Dr. August Saucer and hold off on any golf activities aside from chipping and putting for the next several weeks.  He can try to slowly work back into full swing activities but if he has any pain with any weight lifting or golf activities, hold off on these until his follow-up visit.  Follow-Up Instructions: No follow-ups on file.   Orders:  No orders of the defined types were placed in this encounter.  No orders of the defined types were placed in this encounter.     Procedures: No procedures performed   Clinical Data: No additional findings.  Objective: Vital Signs: There were no vitals taken for this visit.  Physical Exam:  Constitutional: Patient appears well-developed HEENT:  Head: Normocephalic Eyes:EOM are normal Neck: Normal range of motion  Cardiovascular: Normal rate Pulmonary/chest: Effort normal Neurologic: Patient is alert Skin: Skin is warm Psychiatric: Patient has normal mood and affect  Ortho Exam: Ortho exam demonstrates left shoulder with 45 degrees X rotation, 90 degrees abduction, 160 degrees forward elevation.  Axillary nerve intact with deltoid firing.  Excellent rotator cuff strength of supra, infra, subscap rated 5/5.  2+ radial pulse of the left upper extremity.  Intact EPL, FPL, finger abduction, grip strength testing, pronation/bicep flexion/tricep extension.  Deltoid function intact.  Does have some weakness and pain reproduced primarily with supination of the left arm with the strength rated  4/5 relative to 5/5 of the right arm with no pain in the right arm.  There is no coarseness or grinding noted with passive motion of the shoulder.  There is no Popeye deformity noted.  No bruising or ecchymosis or swelling noted.  Specialty Comments:  No specialty comments available.  Imaging: No results found.   PMFS History: Patient Active Problem List   Diagnosis Date Noted   DOE (dyspnea on exertion) 03/06/2022   Palpitations 03/06/2022   Nonspecific abnormal electrocardiogram (ECG) (EKG) 03/06/2022   Essential hypertension 03/06/2022   Mixed hyperlipidemia 03/06/2022   S/P total knee arthroplasty, right 02/07/2020   Degenerative arthritis of knee 07/05/2014   Past Medical History:  Diagnosis Date   Arthritis    GERD (gastroesophageal reflux disease)    Hyperlipidemia    Hypertension     Family History  Problem Relation Age of Onset   Heart attack Mother    Cirrhosis Father    CAD Father     Past Surgical History:  Procedure Laterality Date   COLONOSCOPY     KNEE ARTHROSCOPY Left    TOE SURGERY Right    TONSILLECTOMY     TOTAL KNEE ARTHROPLASTY Left 07/05/2014   Procedure: TOTAL KNEE ARTHROPLASTY;  Surgeon: Cammy Copa, MD;  Location: [facility];  Service: Orthopedics;  Laterality: Left;   TOTAL KNEE ARTHROPLASTY Right 02/07/2020   Procedure: RIGHT TOTAL KNEE ARTHROPLASTY;  Surgeon: Cammy Copa, MD;  Location: [facility];  Service: Orthopedics;  Laterality: Right;   VASECTOMY     VASECTOMY REVERSAL     Social History   Occupational History   Not on file  Tobacco Use   Smoking status: Former    Current packs/day: 0.00    Types: Cigarettes    Quit date: 03/27/1998    Years since quitting: 25.2   Smokeless tobacco: Never  Vaping Use   Vaping status: Never Used  Substance and Sexual Activity   Alcohol use: No   Drug use: No   Sexual activity: Yes

## 2023-06-12 DIAGNOSIS — R972 Elevated prostate specific antigen [PSA]: Secondary | ICD-10-CM | POA: Diagnosis not present

## 2023-07-02 ENCOUNTER — Ambulatory Visit: Payer: Medicare Other | Admitting: Orthopedic Surgery

## 2023-08-07 DIAGNOSIS — D1801 Hemangioma of skin and subcutaneous tissue: Secondary | ICD-10-CM | POA: Diagnosis not present

## 2023-08-07 DIAGNOSIS — D2272 Melanocytic nevi of left lower limb, including hip: Secondary | ICD-10-CM | POA: Diagnosis not present

## 2023-08-07 DIAGNOSIS — Z85820 Personal history of malignant melanoma of skin: Secondary | ICD-10-CM | POA: Diagnosis not present

## 2023-08-07 DIAGNOSIS — L570 Actinic keratosis: Secondary | ICD-10-CM | POA: Diagnosis not present

## 2023-08-07 DIAGNOSIS — L812 Freckles: Secondary | ICD-10-CM | POA: Diagnosis not present

## 2023-08-07 DIAGNOSIS — D225 Melanocytic nevi of trunk: Secondary | ICD-10-CM | POA: Diagnosis not present

## 2023-08-07 DIAGNOSIS — Z85828 Personal history of other malignant neoplasm of skin: Secondary | ICD-10-CM | POA: Diagnosis not present

## 2023-08-07 DIAGNOSIS — L821 Other seborrheic keratosis: Secondary | ICD-10-CM | POA: Diagnosis not present

## 2023-08-07 DIAGNOSIS — L720 Epidermal cyst: Secondary | ICD-10-CM | POA: Diagnosis not present

## 2023-11-10 DIAGNOSIS — J438 Other emphysema: Secondary | ICD-10-CM | POA: Diagnosis not present

## 2023-11-10 DIAGNOSIS — J40 Bronchitis, not specified as acute or chronic: Secondary | ICD-10-CM | POA: Diagnosis not present

## 2023-11-28 IMAGING — XA DG FLUORO GUIDE NDL PLC/BX
2 series · 2 of 2 positions shown · non-contrast
Comparison: none

CLINICAL DATA: Left shoulder pain.

[Series 1: ortho standard · 1 of 1 slices shown (1 of 2)]
[im 1/1]
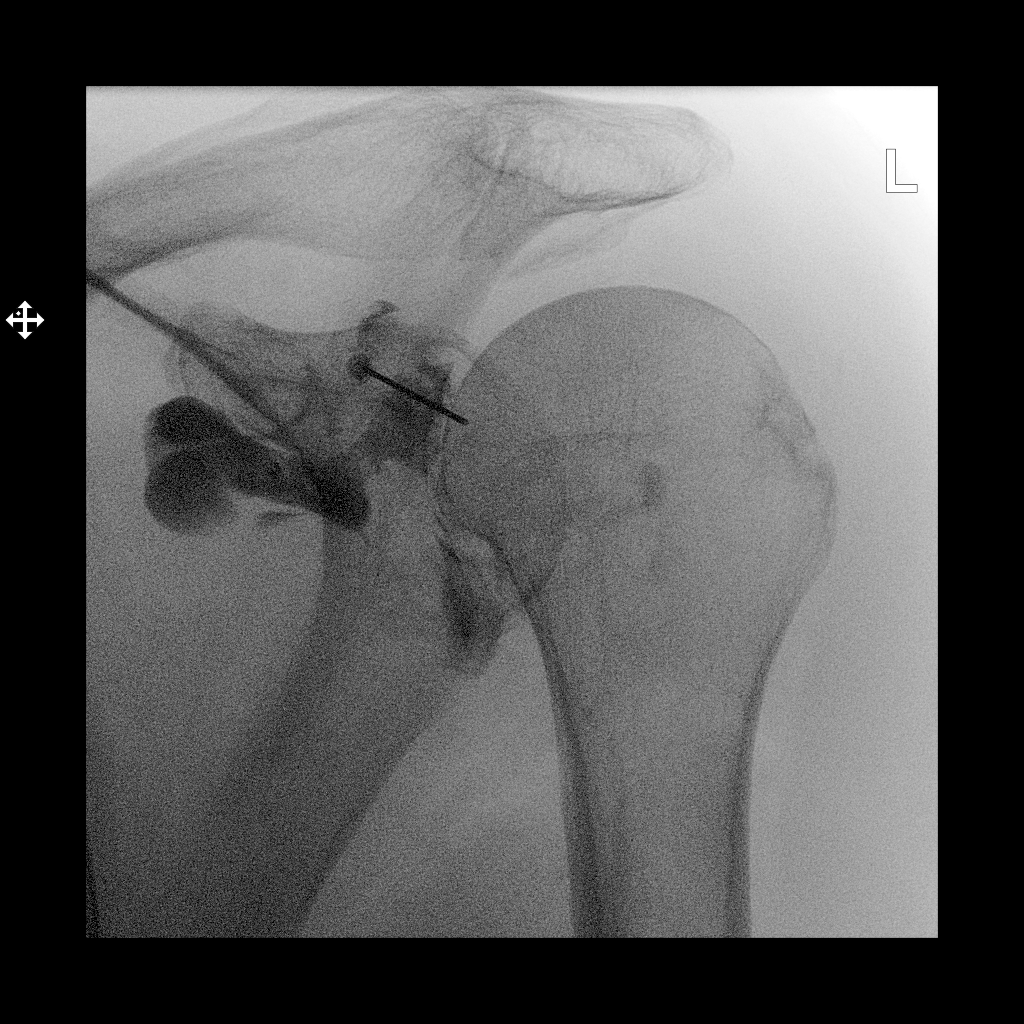

[Series 2: ortho standard · 1 of 1 slices shown (2 of 2)]
[im 1/1]
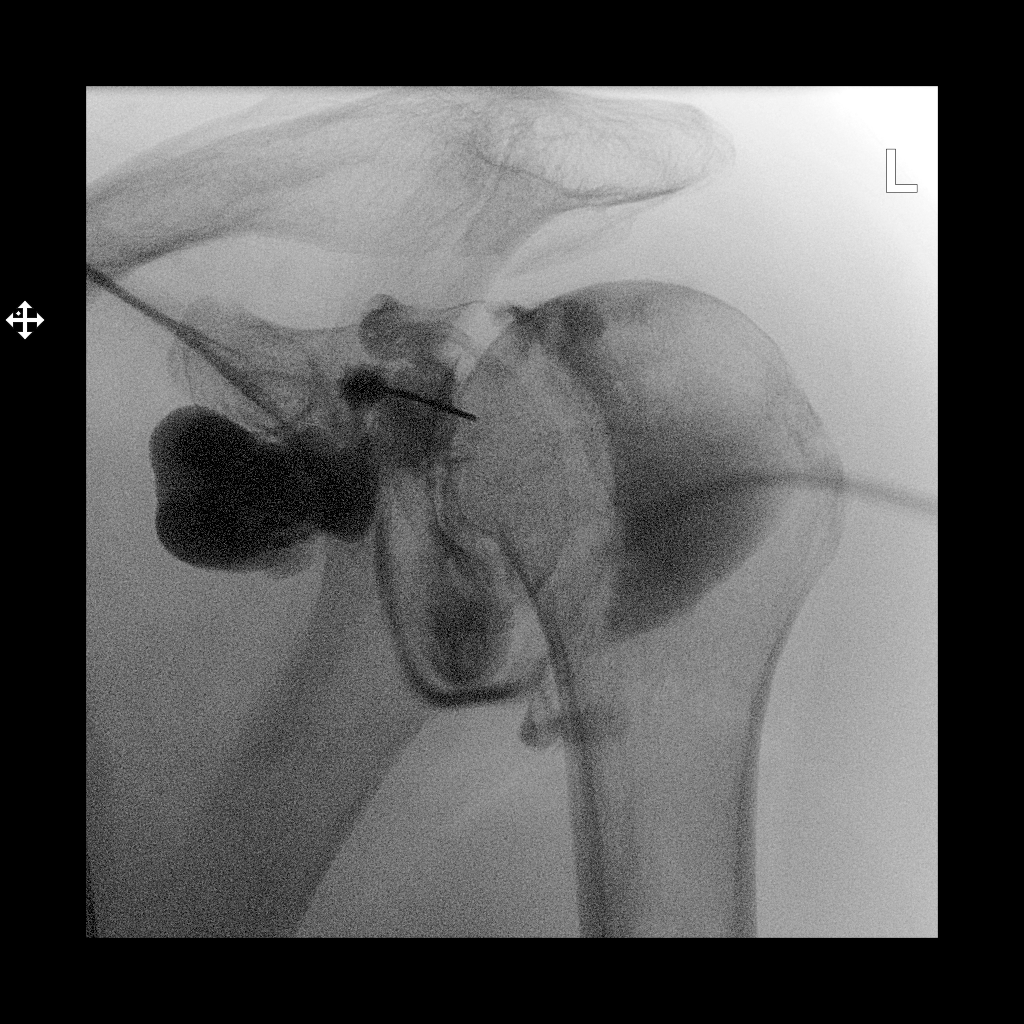

[2 of 2 positions shown; findings below may reference images not displayed]

FLUOROSCOPY TIME:  Radiation Exposure Index (as provided by the
fluoroscopic device): 0.9 mGy

Fluoroscopy Time:  20 seconds

Number of Acquired Images:  0

PROCEDURE:
The risks and benefits of the procedure were discussed with the
patient, and written informed consent was obtained. The patient
stated no history of allergy to contrast media. A formal timeout
procedure was performed with the patient according to departmental
protocol.

The patient was placed supine on the fluoroscopy table and the left
glenohumeral joint was identified under fluoroscopy. The skin
overlying the left glenohumeral joint was subsequently cleaned with
Betadine and a sterile drape was placed over the area of interest. 2
ml 1% Lidocaine was used to anesthetize the skin around the needle
insertion site.

A 20 gauge spinal needle was inserted into the left glenohumeral
joint under fluoroscopy.

12 ml of gadolinium mixture (0.1 ml of Multihance mixed with 15 ml
of Isovue-M 200 contrast and 5 ml of sterile saline) were injected
into the left glenohumeral joint.

The needle was removed and hemostasis was achieved. The patient was
subsequently transferred to MRI for imaging.

This exam was performed by Bello Muhammed Areola PA, and was supervised
and interpreted by Siu Tong Nha.
IMPRESSION: Technically successful left shoulder injection for MRI.

## 2023-12-23 DIAGNOSIS — Z961 Presence of intraocular lens: Secondary | ICD-10-CM | POA: Diagnosis not present

## 2023-12-23 DIAGNOSIS — D3131 Benign neoplasm of right choroid: Secondary | ICD-10-CM | POA: Diagnosis not present

## 2023-12-23 DIAGNOSIS — H353131 Nonexudative age-related macular degeneration, bilateral, early dry stage: Secondary | ICD-10-CM | POA: Diagnosis not present

## 2023-12-23 DIAGNOSIS — H532 Diplopia: Secondary | ICD-10-CM | POA: Diagnosis not present

## 2023-12-23 DIAGNOSIS — H52203 Unspecified astigmatism, bilateral: Secondary | ICD-10-CM | POA: Diagnosis not present

## 2024-02-05 DIAGNOSIS — D1801 Hemangioma of skin and subcutaneous tissue: Secondary | ICD-10-CM | POA: Diagnosis not present

## 2024-02-05 DIAGNOSIS — L821 Other seborrheic keratosis: Secondary | ICD-10-CM | POA: Diagnosis not present

## 2024-02-05 DIAGNOSIS — C4441 Basal cell carcinoma of skin of scalp and neck: Secondary | ICD-10-CM | POA: Diagnosis not present

## 2024-02-05 DIAGNOSIS — Z85820 Personal history of malignant melanoma of skin: Secondary | ICD-10-CM | POA: Diagnosis not present

## 2024-02-05 DIAGNOSIS — B353 Tinea pedis: Secondary | ICD-10-CM | POA: Diagnosis not present

## 2024-02-05 DIAGNOSIS — L570 Actinic keratosis: Secondary | ICD-10-CM | POA: Diagnosis not present

## 2024-02-05 DIAGNOSIS — C490 Malignant neoplasm of connective and soft tissue of head, face and neck: Secondary | ICD-10-CM | POA: Diagnosis not present

## 2024-02-05 DIAGNOSIS — Z85828 Personal history of other malignant neoplasm of skin: Secondary | ICD-10-CM | POA: Diagnosis not present

## 2024-02-05 DIAGNOSIS — D485 Neoplasm of uncertain behavior of skin: Secondary | ICD-10-CM | POA: Diagnosis not present

## 2024-02-05 DIAGNOSIS — L812 Freckles: Secondary | ICD-10-CM | POA: Diagnosis not present

## 2024-03-09 DIAGNOSIS — Z85828 Personal history of other malignant neoplasm of skin: Secondary | ICD-10-CM | POA: Diagnosis not present

## 2024-03-09 DIAGNOSIS — C44329 Squamous cell carcinoma of skin of other parts of face: Secondary | ICD-10-CM | POA: Diagnosis not present

## 2024-03-30 ENCOUNTER — Other Ambulatory Visit (HOSPITAL_BASED_OUTPATIENT_CLINIC_OR_DEPARTMENT_OTHER): Payer: Self-pay

## 2024-03-30 DIAGNOSIS — Z23 Encounter for immunization: Secondary | ICD-10-CM | POA: Diagnosis not present

## 2024-03-30 MED ORDER — COMIRNATY 30 MCG/0.3ML IM SUSY
0.3000 mL | PREFILLED_SYRINGE | Freq: Once | INTRAMUSCULAR | 0 refills | Status: AC
  Filled 2024-03-30: qty 0.3, 1d supply, fill #0

## 2024-04-22 ENCOUNTER — Other Ambulatory Visit (HOSPITAL_BASED_OUTPATIENT_CLINIC_OR_DEPARTMENT_OTHER): Payer: Self-pay

## 2024-04-22 DIAGNOSIS — Z23 Encounter for immunization: Secondary | ICD-10-CM | POA: Diagnosis not present

## 2024-04-22 MED ORDER — FLUZONE HIGH-DOSE 0.5 ML IM SUSY
0.5000 mL | PREFILLED_SYRINGE | Freq: Once | INTRAMUSCULAR | 0 refills | Status: AC
  Filled 2024-04-22: qty 0.5, 1d supply, fill #0

## 2024-04-26 ENCOUNTER — Encounter: Payer: Self-pay | Admitting: Radiology

## 2024-04-27 ENCOUNTER — Other Ambulatory Visit: Payer: Self-pay | Admitting: Internal Medicine

## 2024-04-27 DIAGNOSIS — M545 Low back pain, unspecified: Secondary | ICD-10-CM | POA: Diagnosis not present

## 2024-04-27 DIAGNOSIS — E782 Mixed hyperlipidemia: Secondary | ICD-10-CM | POA: Diagnosis not present

## 2024-04-27 DIAGNOSIS — R911 Solitary pulmonary nodule: Secondary | ICD-10-CM

## 2024-04-27 DIAGNOSIS — Z23 Encounter for immunization: Secondary | ICD-10-CM | POA: Diagnosis not present

## 2024-04-27 DIAGNOSIS — Z87891 Personal history of nicotine dependence: Secondary | ICD-10-CM | POA: Diagnosis not present

## 2024-04-27 DIAGNOSIS — Z0000 Encounter for general adult medical examination without abnormal findings: Secondary | ICD-10-CM | POA: Diagnosis not present

## 2024-04-27 DIAGNOSIS — J432 Centrilobular emphysema: Secondary | ICD-10-CM | POA: Diagnosis not present

## 2024-04-27 DIAGNOSIS — N1831 Chronic kidney disease, stage 3a: Secondary | ICD-10-CM | POA: Diagnosis not present

## 2024-04-27 DIAGNOSIS — I10 Essential (primary) hypertension: Secondary | ICD-10-CM | POA: Diagnosis not present

## 2024-04-27 DIAGNOSIS — I2510 Atherosclerotic heart disease of native coronary artery without angina pectoris: Secondary | ICD-10-CM | POA: Diagnosis not present

## 2024-04-27 DIAGNOSIS — Z79899 Other long term (current) drug therapy: Secondary | ICD-10-CM | POA: Diagnosis not present

## 2024-04-27 DIAGNOSIS — Z1331 Encounter for screening for depression: Secondary | ICD-10-CM | POA: Diagnosis not present

## 2024-04-27 DIAGNOSIS — Z125 Encounter for screening for malignant neoplasm of prostate: Secondary | ICD-10-CM | POA: Diagnosis not present

## 2024-04-27 DIAGNOSIS — R7303 Prediabetes: Secondary | ICD-10-CM | POA: Diagnosis not present

## 2024-04-27 DIAGNOSIS — R202 Paresthesia of skin: Secondary | ICD-10-CM | POA: Diagnosis not present

## 2024-05-04 ENCOUNTER — Ambulatory Visit
Admission: RE | Admit: 2024-05-04 | Discharge: 2024-05-04 | Disposition: A | Discharge status: Home / Self Care | Source: Ambulatory Visit | Attending: Internal Medicine | Admitting: Internal Medicine

## 2024-05-04 DIAGNOSIS — J984 Other disorders of lung: Secondary | ICD-10-CM | POA: Diagnosis not present

## 2024-05-04 DIAGNOSIS — R911 Solitary pulmonary nodule: Secondary | ICD-10-CM

## 2024-05-04 DIAGNOSIS — J439 Emphysema, unspecified: Secondary | ICD-10-CM | POA: Diagnosis not present

## 2024-05-06 DIAGNOSIS — J439 Emphysema, unspecified: Secondary | ICD-10-CM | POA: Diagnosis not present

## 2024-05-06 DIAGNOSIS — J984 Other disorders of lung: Secondary | ICD-10-CM | POA: Diagnosis not present

## 2024-05-27 ENCOUNTER — Other Ambulatory Visit: Payer: Self-pay

## 2024-05-27 ENCOUNTER — Ambulatory Visit: Admitting: Orthopedic Surgery

## 2024-05-27 DIAGNOSIS — M25511 Pain in right shoulder: Secondary | ICD-10-CM

## 2024-05-28 ENCOUNTER — Encounter: Payer: Self-pay | Admitting: Orthopedic Surgery

## 2024-05-28 NOTE — Progress Notes (Signed)
 Office Visit Note   Patient: Billy Fields           Date of Birth: Nov 21, 1950           MRN: [id number] Visit Date: 05/27/2024 Requested by: Charlott Dorn LABOR, MD [address] [place],  [place] [address] PCP: Charlott Dorn LABOR, MD  Subjective: Chief Complaint  Patient presents with   Right Shoulder - Pain    HPI: Billy Fields is a 73 y.o. male who presents to the office reporting right shoulder pain.  Describes an old injury about 10 years ago.  He was lifting up a deck for water  sports equipment when he felt a pop in his shoulder.  Has had pain since that time but it has been manageable.  No left shoulder problems with history of prior surgery on that shoulder.  Is hard for him to carry heavy things.  The pain does wake him up from sleep when he tosses and turns on that right-hand side.  Denies any neck pain or radicular symptoms.  He is able to play golf.  Tylenol  helps some..                ROS: All systems reviewed are negative as they relate to the chief complaint within the history of present illness.  Patient denies fevers or chills.  Assessment & Plan: Visit Diagnoses:  1. Right shoulder pain, unspecified chronicity     Plan: Impression is likely right shoulder rotator cuff tear of the supraspinatus with retraction.  Based on history may or may not be repairable.  Biceps tendon may also be giving him some symptoms at this time.  He has very good strength and motion in that right shoulder.  The sleep disturbance is what is causing him problems at this time.  Plan is for MRI arthrogram of the right shoulder to evaluate rotator cuff tear and biceps tendinitis.  Follow-up after that study.  May consider injection at that time based on symptoms.  Follow-Up Instructions: No follow-ups on file.   Orders:  Orders Placed This Encounter  Procedures   XR Shoulder Right   MR SHOULDER RIGHT W CONTRAST   Arthrogram   No orders of the defined types were  placed in this encounter.     Procedures: No procedures performed   Clinical Data: No additional findings.  Objective: Vital Signs: There were no vitals taken for this visit.  Physical Exam:  Constitutional: Patient appears well-developed HEENT:  Head: Normocephalic Eyes:EOM are normal Neck: Normal range of motion Cardiovascular: Normal rate Pulmonary/chest: Effort normal Neurologic: Patient is alert Skin: Skin is warm Psychiatric: Patient has normal mood and affect  Ortho Exam: Ortho exam demonstrates bilateral shoulder range of motion of 70/110/160.  Does have some coarseness on the right with internal and external rotation at 90 degrees of abduction.  Rotator cuff strength however is excellent infraspinatus supraspinatus and subscap muscle testing.  Does have a little bit of biceps tendon groove tenderness.  No discrete AC joint tenderness right versus left.  Cervical spine range of motion is full.  Motor or sensory function both hands intact.  Specialty Comments:  No specialty comments available.  Imaging: XR Shoulder Right Result Date: 05/28/2024 AP outlet axillary lateral radiographs right shoulder reviewed.  No acute fracture.  Shoulder is located.  Acromiohumeral distance is 2 mm from normal.  No significant degenerative changes in the glenohumeral or AC joint.  Visualized lung fields clear  PMFS History: Patient Active Problem List   Diagnosis Date Noted   DOE (dyspnea on exertion) 03/06/2022   Palpitations 03/06/2022   Nonspecific abnormal electrocardiogram (ECG) (EKG) 03/06/2022   Essential hypertension 03/06/2022   Mixed hyperlipidemia 03/06/2022   S/P total knee arthroplasty, right 02/07/2020   Degenerative arthritis of knee 07/05/2014   Past Medical History:  Diagnosis Date   Arthritis    GERD (gastroesophageal reflux disease)    Hyperlipidemia    Hypertension     Family History  Problem Relation Age of Onset   Heart attack Mother     Cirrhosis Father    CAD Father     Past Surgical History:  Procedure Laterality Date   COLONOSCOPY     KNEE ARTHROSCOPY Left    TOE SURGERY Right    TONSILLECTOMY     TOTAL KNEE ARTHROPLASTY Left 07/05/2014   Procedure: TOTAL KNEE ARTHROPLASTY;  Surgeon: Cordella Glendia Hutchinson, MD;  Location: [facility];  Service: Orthopedics;  Laterality: Left;   TOTAL KNEE ARTHROPLASTY Right 02/07/2020   Procedure: RIGHT TOTAL KNEE ARTHROPLASTY;  Surgeon: Hutchinson Cordella Glendia, MD;  Location: [facility];  Service: Orthopedics;  Laterality: Right;   VASECTOMY     VASECTOMY REVERSAL     Social History   Occupational History   Not on file  Tobacco Use   Smoking status: Former    Current packs/day: 0.00    Types: Cigarettes    Quit date: 03/27/1998    Years since quitting: 26.1   Smokeless tobacco: Never  Vaping Use   Vaping status: Never Used  Substance and Sexual Activity   Alcohol  use: No   Drug use: No   Sexual activity: Yes

## 2024-06-10 ENCOUNTER — Inpatient Hospital Stay
Admission: RE | Admit: 2024-06-10 | Discharge: 2024-06-10 | Attending: Orthopedic Surgery | Admitting: Orthopedic Surgery

## 2024-06-10 DIAGNOSIS — M25511 Pain in right shoulder: Secondary | ICD-10-CM

## 2024-06-10 MED ORDER — IOPAMIDOL (ISOVUE-M 200) INJECTION 41%
10.0000 mL | Freq: Once | INTRAMUSCULAR | Status: AC
  Administered 2024-06-10: 16:00:00 10 mL via INTRA_ARTICULAR

## 2024-06-10 MED ORDER — GADOBENATE DIMEGLUMINE 529 MG/ML IV SOLN
0.1000 mL | Freq: Once | INTRAVENOUS | Status: AC | PRN
  Administered 2024-06-10: 16:00:00 0.1 mL via INTRAVENOUS

## 2024-06-28 ENCOUNTER — Encounter: Payer: Self-pay | Admitting: Orthopedic Surgery

## 2024-06-28 ENCOUNTER — Ambulatory Visit (INDEPENDENT_AMBULATORY_CARE_PROVIDER_SITE_OTHER): Admitting: Orthopedic Surgery

## 2024-06-28 DIAGNOSIS — M25511 Pain in right shoulder: Secondary | ICD-10-CM

## 2024-06-28 NOTE — Progress Notes (Signed)
 "  Office Visit Note   Patient: Billy Fields           Date of Birth: 18-May-1951           MRN: [id number] Visit Date: 06/28/2024 Requested by: Charlott Dorn LABOR, MD [address] [place],  [place] [address] PCP: Charlott Dorn LABOR, MD  Subjective: Chief Complaint  Patient presents with   Other    Follow up to review scan    HPI: Billy Fields is a 74 y.o. male who presents to the office reporting right shoulder pain.  Since he was last seen has had an MRI scan of the right shoulder which is reviewed with the patient.  That scan does show retracted supraspinatus tear to the top of the humeral head with fairly minimal atrophy.  Infraspinatus looks intact.  There is at least a partial tear of the subscapularis with medial subluxation of the biceps tendon present.  Patient reports primarily pain but no significant loss of functional strength.  Notably he did have rotator cuff repair on the left shoulder about 2 years ago.  The 2 scans appear very similar with the exception of increased subscapularis involvement on the right-hand side compared to the left.  .                ROS: All systems reviewed are negative as they relate to the chief complaint within the history of present illness.  Patient denies fevers or chills.  Assessment & Plan: Visit Diagnoses:  1. Right shoulder pain, unspecified chronicity     Plan: Impression is right shoulder rotator cuff tear with medial subluxation of the biceps tendon which I suspect is his main pain generator.  Partial-thickness tearing of the subscap is also present which was not present on the left-hand side.  In general Billy Fields is a 74 year old patient he is very active.  Likes to play golf.  Likes to do projects around the house.  Operative management could be considered for rotator cuff repair based primarily on his good result on the left-hand side.  For [name] he does have very good external rotation strength but if his  subscap tear is completely he will have significant functional limitation which would potentially put him into the category of reverse shoulder replacement depending on the level of activity he wants to maintain.  Conversely with rotator cuff repair although less predictable in this age group he could have a similar result to his left-hand side and avoid the potential for shoulder replacement in the future.  He will consider his options and let us  know when he comes back from vacation in [place]  what he wants to do.  Follow-Up Instructions: No follow-ups on file.   Orders:  No orders of the defined types were placed in this encounter.  No orders of the defined types were placed in this encounter.     Procedures: No procedures performed   Clinical Data: No additional findings.  Objective: Vital Signs: There were no vitals taken for this visit.  Physical Exam:  Constitutional: Patient appears well-developed HEENT:  Head: Normocephalic Eyes:EOM are normal Neck: Normal range of motion Cardiovascular: Normal rate Pulmonary/chest: Effort normal Neurologic: Patient is alert Skin: Skin is warm Psychiatric: Patient has normal mood and affect  Ortho Exam: Ortho exam demonstrates excellent range of motion in both shoulders of 60/95/160.  Surprisingly on the right-hand side the patient has excellent external rotation strength as well as subscap strength bilaterally.  No Popeye  deformity on the right.  No AC joint tenderness on the right.  Does have some coarseness with passive range of motion at 90 degrees of abduction consistent with his known diagnosis of rotator cuff pathology.  Supraspinatus strength is a little weaker with isolation on the right compared to the left.  Specialty Comments:  No specialty comments available.  Imaging: No results found.   PMFS History: Patient Active Problem List   Diagnosis Date Noted   DOE (dyspnea on exertion) 03/06/2022   Palpitations  03/06/2022   Nonspecific abnormal electrocardiogram (ECG) (EKG) 03/06/2022   Essential hypertension 03/06/2022   Mixed hyperlipidemia 03/06/2022   S/P total knee arthroplasty, right 02/07/2020   Degenerative arthritis of knee 07/05/2014   Past Medical History:  Diagnosis Date   Arthritis    GERD (gastroesophageal reflux disease)    Hyperlipidemia    Hypertension     Family History  Problem Relation Age of Onset   Heart attack Mother    Cirrhosis Father    CAD Father     Past Surgical History:  Procedure Laterality Date   COLONOSCOPY     KNEE ARTHROSCOPY Left    TOE SURGERY Right    TONSILLECTOMY     TOTAL KNEE ARTHROPLASTY Left 07/05/2014   Procedure: TOTAL KNEE ARTHROPLASTY;  Surgeon: Cordella Glendia Hutchinson, MD;  Location: [facility] OR;  Service: Orthopedics;  Laterality: Left;   TOTAL KNEE ARTHROPLASTY Right 02/07/2020   Procedure: RIGHT TOTAL KNEE ARTHROPLASTY;  Surgeon: Hutchinson Cordella Glendia, MD;  Location: [facility] ORS;  Service: Orthopedics;  Laterality: Right;   VASECTOMY     VASECTOMY REVERSAL     Social History   Occupational History   Not on file  Tobacco Use   Smoking status: Former    Current packs/day: 0.00    Types: Cigarettes    Quit date: 03/27/1998    Years since quitting: 26.2   Smokeless tobacco: Never  Vaping Use   Vaping status: Never Used  Substance and Sexual Activity   Alcohol  use: No   Drug use: No   Sexual activity: Yes        "

## 2024-07-09 ENCOUNTER — Encounter: Payer: Self-pay | Admitting: Orthopedic Surgery

## 2024-07-09 NOTE — Telephone Encounter (Signed)
 Hi can you fix me up with a blue sheet that I can fill out so that we can call him.  Thanks

## 2024-07-13 NOTE — Telephone Encounter (Signed)
 Thx debbie

## 2024-08-09 ENCOUNTER — Encounter: Admitting: Surgical
